# Patient Record
Sex: Female | Born: 1990 | Race: Black or African American | Hispanic: No | Marital: Single | Smoking: Never smoker
Health system: Southern US, Community
[De-identification: ages and names within clinical notes are randomized; demographics above are authoritative.]

## PROBLEM LIST (undated history)

## (undated) ENCOUNTER — Inpatient Hospital Stay (HOSPITAL_COMMUNITY): Payer: Self-pay

## (undated) DIAGNOSIS — T7840XA Allergy, unspecified, initial encounter: Secondary | ICD-10-CM

## (undated) DIAGNOSIS — L709 Acne, unspecified: Secondary | ICD-10-CM

## (undated) DIAGNOSIS — E119 Type 2 diabetes mellitus without complications: Secondary | ICD-10-CM

## (undated) HISTORY — DX: Acne, unspecified: L70.9

## (undated) HISTORY — DX: Allergy, unspecified, initial encounter: T78.40XA

## (undated) HISTORY — DX: Type 2 diabetes mellitus without complications: E11.9

---
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
## 2012-08-23 ENCOUNTER — Emergency Department: Payer: Self-pay | Admitting: Emergency Medicine

## 2015-04-11 ENCOUNTER — Ambulatory Visit
Admission: EM | Admit: 2015-04-11 | Discharge: 2015-04-11 | Disposition: A | Discharge status: Home / Self Care | Payer: Self-pay

## 2015-04-12 ENCOUNTER — Ambulatory Visit
Admission: EM | Admit: 2015-04-12 | Discharge: 2015-04-12 | Disposition: A | Discharge status: Home / Self Care | Payer: BLUE CROSS/BLUE SHIELD | Attending: Family Medicine | Admitting: Family Medicine

## 2015-04-12 ENCOUNTER — Encounter: Payer: Self-pay | Admitting: Emergency Medicine

## 2015-04-12 DIAGNOSIS — L298 Other pruritus: Secondary | ICD-10-CM

## 2015-04-12 DIAGNOSIS — N898 Other specified noninflammatory disorders of vagina: Secondary | ICD-10-CM

## 2015-04-12 DIAGNOSIS — B373 Candidiasis of vulva and vagina: Secondary | ICD-10-CM | POA: Diagnosis not present

## 2015-04-12 DIAGNOSIS — B3731 Acute candidiasis of vulva and vagina: Secondary | ICD-10-CM

## 2015-04-12 LAB — CHLAMYDIA/NGC RT PCR (ARMC ONLY)
Chlamydia Tr: NOT DETECTED
N gonorrhoeae: NOT DETECTED

## 2015-04-12 LAB — WET PREP, GENITAL
Clue Cells Wet Prep HPF POC: NONE SEEN
Sperm: NONE SEEN
Trich, Wet Prep: NONE SEEN
Yeast Wet Prep HPF POC: NONE SEEN

## 2015-04-12 MED ORDER — FLUCONAZOLE 150 MG PO TABS: 150.0000 mg | ORAL_TABLET | Freq: Every day | ORAL | Status: DC

## 2015-04-12 NOTE — ED Provider Notes (Signed)
CSN: [id number]     Arrival date & time 04/12/15  [id number] History   First MD Initiated Contact with Patient 04/12/15 1133     Chief Complaint  Patient presents with  . Vaginal Itching   (Consider location/radiation/quality/duration/timing/severity/associated sxs/prior Treatment) HPI   A 24 year old female who presents with 3 day history of vaginal itching with a white vaginal discharge. He states that she's never had a yeast infection in the past. She is on chronic doxycycline use because of acne. She does not have any urinary tract symptoms. She is sexually active in a monogamous relationship.  History reviewed. No pertinent past medical history. History reviewed. No pertinent past surgical history. History reviewed. No pertinent family history. Social History  Substance Use Topics  . Smoking status: Former Games developermoker  . Smokeless tobacco: None  . Alcohol Use: Yes   OB History    No data available     Review of Systems  Constitutional: Negative for fever, chills, activity change and fatigue.  Genitourinary: Positive for vaginal discharge.  All other systems reviewed and are negative.   Allergies  Review of patient's allergies indicates no known allergies.  Home Medications   Prior to Admission medications   Medication Sig Start Date End Date Taking? Authorizing Provider  doxycycline (DORYX) 100 MG EC tablet Take 100 mg by mouth 2 (two) times daily.   Yes Historical Provider, MD  norethindrone-ethinyl estradiol (JUNEL FE,GILDESS FE,LOESTRIN FE) 1-20 MG-MCG tablet Take 1 tablet by mouth daily.   Yes Historical Provider, MD  fluconazole (DIFLUCAN) 150 MG tablet Take 1 tablet (150 mg total) by mouth daily. 04/12/15   Lutricia FeilWilliam P Roemer, PA-C   Meds Ordered and Administered this Visit  Medications - No data to display  BP 126/71 mmHg  Pulse 66  Temp(Src) 97.2 F (36.2 C) (Tympanic)  Resp 16  Ht 5\' 11"  (1.803 m)  Wt 190 lb (86.183 kg)  BMI 26.51 kg/m2  SpO2 98%  LMP  03/23/2015 (Approximate) No data found.   Physical Exam  Constitutional: She is oriented to person, place, and time. She appears well-developed and well-nourished. No distress.  HENT:  Head: Normocephalic and atraumatic.  Eyes: Conjunctivae are normal. Pupils are equal, round, and reactive to light.  Neck: Normal range of motion. Neck supple.  Abdominal: Soft. Bowel sounds are normal. She exhibits no distension. There is no tenderness. There is no rebound and no guarding.  Genitourinary: Uterus normal. Vaginal discharge found.  Examination was carried out with Herbert SetaHeather RN as my assistant and chaperone external genitalia appears normal but with a white creamy discharge noticeable at the introitus. There is no inflammation seen and no erythema is present. Speculum exam showed the vaginal walls to be coated with the white discharge. Samples were obtained from the vaginal walls and submitted for analysis. The speculum was withdrawn and a bimanual was performed. There is no tenderness to cervical motion no tenderness in either adnexa.  Musculoskeletal: Normal range of motion. She exhibits no edema or tenderness.  Neurological: She is alert and oriented to person, place, and time.  Skin: Skin is warm and dry. She is not diaphoretic.  Psychiatric: She has a normal mood and affect. Her behavior is normal. Judgment and thought content normal.  Nursing note and vitals reviewed.   ED Course  Procedures (including critical care time)  Labs Review Labs Reviewed  WET PREP, GENITAL - Abnormal; Notable for the following:    WBC, Wet Prep HPF POC MANY (*)    All  other components within normal limits  CHLAMYDIA/NGC RT PCR [facility] ONLY)    Imaging Review No results found.   Visual Acuity Review  Right Eye Distance:   Left Eye Distance:   Bilateral Distance:    Right Eye Near:   Left Eye Near:    Bilateral Near:         MDM   1. Vaginal itching   2. Candida vaginitis     Discharge Medication List as of 04/12/2015 12:32 PM    START taking these medications   Details  fluconazole (DIFLUCAN) 150 MG tablet Take 1 tablet (150 mg total) by mouth daily., Starting 04/12/2015, Until Discontinued, Normal      Plan: 1. Test/x-ray results and diagnosis reviewed with patient 2. rx as per orders; risks, benefits, potential side effects reviewed with patient 3. Recommend supportive treatment with these medications and was cautioned regarding use of alcohol or sex during treatment. She will call tomorrow for results of the gonorrhea chlamydia panel. I advised her that even though no yeast these cells were seen on the wet prep that her symptoms and the character of her discharge appear to be Candida. Therefore we'll treat her with the Diflucan at this time. 4. F/u prn if symptoms worsen or don't improve     Lutricia Feil, PA-C 04/12/15 1246

## 2015-04-12 NOTE — ED Notes (Signed)
Patient c/o vaginal itching and white vaginal discharge for 3 days.

## 2015-04-12 NOTE — Discharge Instructions (Signed)

## 2015-04-15 ENCOUNTER — Telehealth: Payer: Self-pay | Admitting: *Deleted

## 2015-04-15 NOTE — ED Notes (Signed)
Called patient and informed her that her lab results came back negative for any communicable disease. Patient confirmed understanding of information.

## 2016-02-21 ENCOUNTER — Inpatient Hospital Stay (HOSPITAL_COMMUNITY)
Admission: AD | Admit: 2016-02-21 | Discharge: 2016-02-21 | Disposition: A | Discharge status: Home / Self Care | Payer: BLUE CROSS/BLUE SHIELD | Source: Ambulatory Visit | Attending: Obstetrics and Gynecology | Admitting: Obstetrics and Gynecology

## 2016-02-21 ENCOUNTER — Encounter (HOSPITAL_COMMUNITY): Payer: Self-pay

## 2016-02-21 ENCOUNTER — Inpatient Hospital Stay (HOSPITAL_COMMUNITY): Payer: BLUE CROSS/BLUE SHIELD

## 2016-02-21 DIAGNOSIS — R102 Pelvic and perineal pain: Secondary | ICD-10-CM | POA: Diagnosis present

## 2016-02-21 DIAGNOSIS — Z3A10 10 weeks gestation of pregnancy: Secondary | ICD-10-CM | POA: Diagnosis not present

## 2016-02-21 DIAGNOSIS — Z87891 Personal history of nicotine dependence: Secondary | ICD-10-CM | POA: Diagnosis not present

## 2016-02-21 DIAGNOSIS — O26891 Other specified pregnancy related conditions, first trimester: Secondary | ICD-10-CM | POA: Insufficient documentation

## 2016-02-21 DIAGNOSIS — O3680X0 Pregnancy with inconclusive fetal viability, not applicable or unspecified: Secondary | ICD-10-CM

## 2016-02-21 LAB — POCT PREGNANCY, URINE: Preg Test, Ur: POSITIVE — AB

## 2016-02-21 LAB — URINALYSIS, ROUTINE W REFLEX MICROSCOPIC
BILIRUBIN URINE: NEGATIVE
Glucose, UA: 250 mg/dL — AB
Hgb urine dipstick: NEGATIVE
KETONES UR: NEGATIVE mg/dL
Leukocytes, UA: NEGATIVE
NITRITE: NEGATIVE
PH: 6.5 (ref 5.0–8.0)
PROTEIN: NEGATIVE mg/dL
Specific Gravity, Urine: 1.02 (ref 1.005–1.030)

## 2016-02-21 LAB — HCG, QUANTITATIVE, PREGNANCY: HCG, BETA CHAIN, QUANT, S: 9455 m[IU]/mL — AB (ref <5)

## 2016-02-21 NOTE — MAU Note (Signed)
Went to [facility] in [place], on 10/31 was told couldn't find a heart beat, unsure of LMP thinks first of September, found out she was pregnant on 01/13/16 positive home UPT. Denies pain or bleeding. Had HCG drawn x 2 and number was going down. Wants second opinion regarding possible miscarriage.

## 2016-02-21 NOTE — MAU Note (Signed)
Nicole BourgeoisMarie Chambers CNM in Triage to discuss u/s results and d/c plan with pt. Written and verbal d/c instructions given by CNM and pt d/c home from Triage

## 2016-02-21 NOTE — MAU Provider Note (Signed)
Chief Complaint: No chief complaint on file.   None       SUBJECTIVE HPI: Nicole Chambers is a 25 y.o. G1P0 at Unknown by LMP who presents to maternity admissions reporting desire for second opinion about her pregnancy status. Had originally had some bleeding but has none now.  Was told her HCG levels were dropping and that she was having a miscarriage but she does not believe it.  Wants a second opinion.  HCG levels were around 1500 a week to 10 days ago.. She denies vaginal bleeding, vaginal itching/burning, urinary symptoms, h/a, dizziness, n/v, or fever/chills.     Other  This is a recurrent problem. The current episode started 1 to 4 weeks ago. The problem has been unchanged. Pertinent negatives include no abdominal pain, chills, congestion, fatigue, fever, nausea or vomiting. Nothing aggravates the symptoms. She has tried nothing for the symptoms.   RN Note: [facility] in [place], on 10/31 was told couldn't find a heart beat, unsure of LMP thinks first of September, found out she was pregnant on 01/13/16 positive home UPT. Denies pain or bleeding. Had HCG drawn x 2 and number was going down. Wants second opinion regarding possible miscarriage.   No past medical history on file. No past surgical history on file. Social History   Social History  . Marital status: Single    Spouse name: N/A  . Number of children: N/A  . Years of education: N/A   Occupational History  . Not on file.   Social History Main Topics  . Smoking status: Former Games developermoker  . Smokeless tobacco: Not on file  . Alcohol use Yes  . Drug use: Unknown  . Sexual activity: Not on file   Other Topics Concern  . Not on file   Social History Narrative  . No narrative on file   No current facility-administered medications on file prior to encounter.    Current Outpatient Prescriptions on File Prior to Encounter  Medication Sig Dispense Refill  . doxycycline (DORYX) 100 MG EC tablet Take 100 mg by mouth 2  (two) times daily.    . fluconazole (DIFLUCAN) 150 MG tablet Take 1 tablet (150 mg total) by mouth daily. 1 tablet 1  . norethindrone-ethinyl estradiol (JUNEL FE,GILDESS FE,LOESTRIN FE) 1-20 MG-MCG tablet Take 1 tablet by mouth daily.     No Known Allergies  I have reviewed patient's Past Medical Hx, Surgical Hx, Family Hx, Social Hx, medications and allergies.   ROS:  Review of Systems  Constitutional: Negative for chills, fatigue and fever.  HENT: Negative for congestion.   Gastrointestinal: Negative for abdominal pain, nausea and vomiting.    Other systems negative   Physical Exam  Patient Vitals for the past 24 hrs:  BP Temp Temp src Pulse Resp Height Weight  02/21/16 1625 129/77 - - 81 18 - -  02/21/16 1624 - 98.1 F (36.7 C) Oral - - 5\' 11"  (1.803 m) 219 lb (99.3 kg)   Physical Exam  Constitutional: Well-developed, well-nourished female in no acute distress.  Cardiovascular: normal rate Respiratory: normal effort GI: Abd soft,non tender MS: Extremities nontender, no edema, normal ROM Neurologic: Alert and oriented x 4.  GU: Neg CVAT.  Pelvic not indicated   LAB RESULTS Results for orders placed or performed during the hospital encounter of 02/21/16 (from the past 24 hour(s))  Urinalysis, Routine w reflex microscopic (not at [facility])     Status: Abnormal   Collection Time: 02/21/16  4:30 PM  Result Value Ref Range   Color, Urine YELLOW YELLOW   APPearance HAZY (A) CLEAR   Specific Gravity, Urine 1.020 1.005 - 1.030   pH 6.5 5.0 - 8.0   Glucose, UA 250 (A) NEGATIVE mg/dL   Hgb urine dipstick NEGATIVE NEGATIVE   Bilirubin Urine NEGATIVE NEGATIVE   Ketones, ur NEGATIVE NEGATIVE mg/dL   Protein, ur NEGATIVE NEGATIVE mg/dL   Nitrite NEGATIVE NEGATIVE   Leukocytes, UA NEGATIVE NEGATIVE  Pregnancy, urine POC     Status: Abnormal   Collection Time: 02/21/16  4:34 PM  Result Value Ref Range   Preg Test, Ur POSITIVE (A) NEGATIVE  hCG, quantitative, pregnancy      Status: Abnormal   Collection Time: 02/21/16  4:46 PM  Result Value Ref Range   hCG, Beta Chain, Quant, S 9,455 (H) <5 mIU/mL       IMAGING Possible intrauterine gestational sac, around 5 wks No yolk sac or embryo  MAU Management/MDM: Ordered usual first trimester r/o ectopic labs.   Pelvic exam and cultures done at other practice Will check baseline Ultrasound to rule out ectopic and check on status  Consult Dr Despina HiddenEure with presentation, exam findings, and results.  He recommends repeating HCG in 5 days.  This bleeding/pain can represent a normal pregnancy with bleeding, spontaneous abortion or even an ectopic which can be life-threatening.  The process as listed above helps to determine which of these is present.    ASSESSMENT 1. Pelvic pain in pregnant patient at less than [redacted] weeks gestation   2. Pelvic pain in pregnant patient at less than [redacted] weeks gestation   3.     Pregnancy of unknown location  PLAN Discharge home Plan to repeat HCG level on Wednesday 02/26/16  per 11:00 am schedule Will repeat  Ultrasound in about 7-10 days if HCG levels double appropriately     Pt stable at time of discharge. Encouraged to return here or to other Urgent Care/ED if she develops worsening of symptoms, increase in pain, fever, or other concerning symptoms.    Wynelle BourgeoisMarie Zalan Chambers CNM, MSN Certified Nurse-Midwife 02/21/2016  5:55 PM

## 2016-02-21 NOTE — Discharge Instructions (Signed)

## 2016-02-26 ENCOUNTER — Ambulatory Visit: Payer: BLUE CROSS/BLUE SHIELD

## 2016-02-26 DIAGNOSIS — O3680X0 Pregnancy with inconclusive fetal viability, not applicable or unspecified: Secondary | ICD-10-CM

## 2016-02-26 LAB — HCG, QUANTITATIVE, PREGNANCY: HCG, BETA CHAIN, QUANT, S: 4927 m[IU]/mL — AB (ref <5)

## 2016-02-26 NOTE — Progress Notes (Signed)
Patient presented to office today for a repeat STAT beta check per MAU note and patient. Patient reports no pain or bleeding at this time. Patient reports having a OB in [place] but only came to the hospital for a second opinion.   Reviewed U/S and quant beta with Dr.Arnold who stated patient should returned back to her OB/GYN where she was receiving care. I advised to follow up with them to discuss medication options. Patient voice understanding at this time.

## 2016-02-27 ENCOUNTER — Emergency Department
Admission: EM | Admit: 2016-02-27 | Discharge: 2016-02-27 | Disposition: A | Discharge status: Home / Self Care | Payer: BLUE CROSS/BLUE SHIELD | Attending: Emergency Medicine | Admitting: Emergency Medicine

## 2016-02-27 ENCOUNTER — Encounter: Payer: Self-pay | Admitting: Urgent Care

## 2016-02-27 ENCOUNTER — Emergency Department: Payer: BLUE CROSS/BLUE SHIELD

## 2016-02-27 DIAGNOSIS — O039 Complete or unspecified spontaneous abortion without complication: Secondary | ICD-10-CM | POA: Diagnosis not present

## 2016-02-27 DIAGNOSIS — N898 Other specified noninflammatory disorders of vagina: Secondary | ICD-10-CM | POA: Insufficient documentation

## 2016-02-27 DIAGNOSIS — Z87891 Personal history of nicotine dependence: Secondary | ICD-10-CM | POA: Diagnosis not present

## 2016-02-27 DIAGNOSIS — Z3A01 Less than 8 weeks gestation of pregnancy: Secondary | ICD-10-CM | POA: Diagnosis not present

## 2016-02-27 DIAGNOSIS — O26891 Other specified pregnancy related conditions, first trimester: Secondary | ICD-10-CM | POA: Diagnosis present

## 2016-02-27 LAB — CBC
HEMATOCRIT: 38.4 % (ref 35.0–47.0)
Hemoglobin: 12.7 g/dL (ref 12.0–16.0)
MCH: 30.3 pg (ref 26.0–34.0)
MCHC: 33.1 g/dL (ref 32.0–36.0)
MCV: 91.4 fL (ref 80.0–100.0)
PLATELETS: 352 10*3/uL (ref 150–440)
RBC: 4.21 MIL/uL (ref 3.80–5.20)
RDW: 12.8 % (ref 11.5–14.5)
WBC: 10.7 10*3/uL (ref 3.6–11.0)

## 2016-02-27 LAB — HCG, QUANTITATIVE, PREGNANCY: hCG, Beta Chain, Quant, S: 3802 m[IU]/mL — ABNORMAL HIGH (ref <5)

## 2016-02-27 MED ORDER — NAPROXEN 500 MG PO TABS: 500.0000 mg | ORAL_TABLET | Freq: Two times a day (BID) | ORAL | 2 refills | Status: AC

## 2016-02-27 NOTE — ED Provider Notes (Signed)
[facility] Emergency Department Provider Note  ____________________________________________  Time seen: Approximately 9:29 PM  I have reviewed the triage vital signs and the nursing notes.   HISTORY  Chief Complaint Vaginal Bleeding and Abdominal Cramping   HPI Nicole Chambers is a 25 y.o. female G1P0 who presents for evaluation of vaginal bleeding and abdominal cramping. Patient was told by her OBGYN yesterday for f/u hCG levels and US and was told she had a missed miscarriage. She was given the option of a D&C versus natural passage and she opted for natural passage. She reports that yesterday evening she started to have vaginal bleeding which has increased in intensity today. She is also complaining of suprapubic abdominal cramping, moderate, since earlier this morning, associated with the vaginal bleeding. No nausea or vomiting, no dysuria hematuria, no fever, no chest pain or shortness of breath.   History reviewed. No pertinent past medical history.  There are no active problems to display for this patient.   History reviewed. No pertinent surgical history.  Prior to Admission medications   Medication Sig Start Date End Date Taking? Authorizing Provider  doxycycline (DORYX) 100 MG EC tablet Take 100 mg by mouth 2 (two) times daily.    Historical Provider, MD  fluconazole (DIFLUCAN) 150 MG tablet Take 1 tablet (150 mg total) by mouth daily. 04/12/15   Lutricia FeilWilliam P Roemer, PA-C    Allergies Patient has no known allergies.  Family History  Problem Relation Age of Onset  . Hypertension Mother   . Thyroid disease Mother     Social History Social History  Substance Use Topics  . Smoking status: Former Games developermoker  . Smokeless tobacco: Never Used  . Alcohol use No    Review of Systems  Constitutional: Negative for fever. Eyes: Negative for visual changes. ENT: Negative for sore throat. Cardiovascular: Negative for chest pain. Respiratory: Negative  for shortness of breath. Gastrointestinal: + suprapubic abdominal pain. No vomiting or diarrhea. Genitourinary: Negative for dysuria. + vaginal bleeding Musculoskeletal: Negative for back pain. Skin: Negative for rash. Neurological: Negative for headaches, weakness or numbness.  ____________________________________________   PHYSICAL EXAM:  VITAL SIGNS: ED Triage Vitals  Enc Vitals Group     BP 02/27/16 1925 138/83     Pulse Rate 02/27/16 1925 69     Resp 02/27/16 1925 18     Temp 02/27/16 1925 98 F (36.7 C)     Temp Source 02/27/16 1925 Oral     SpO2 02/27/16 1925 99 %     Weight 02/27/16 1926 219 lb (99.3 kg)     Height 02/27/16 1926 5\' 11"  (1.803 m)     Head Circumference --      Peak Flow --      Pain Score 02/27/16 1926 10     Pain Loc --      Pain Edu? --      Excl. in GC? --     Constitutional: Alert and oriented. Well appearing and in no apparent distress. HEENT:      Head: Normocephalic and atraumatic.         Eyes: Conjunctivae are normal. Sclera is non-icteric. EOMI. PERRL      Mouth/Throat: Mucous membranes are moist.       Neck: Supple with no signs of meningismus. Cardiovascular: Regular rate and rhythm. No murmurs, gallops, or rubs. 2+ symmetrical distal pulses are present in all extremities. No JVD. Respiratory: Normal respiratory effort. Lungs are clear to auscultation bilaterally. No wheezes, crackles,  or rhonchi.  Gastrointestinal: Soft, non tender, and non distended with positive bowel sounds. No rebound or guarding. Genitourinary: No CVA tenderness. Pelvic exam: Normal external genitalia, no rashes or lesions. Normal cervical mucus. Os closed with gestational sac in the os which was evacuated. No cervical motion tenderness.  No uterine or adnexal tenderness.   Musculoskeletal: Nontender with normal range of motion in all extremities. No edema, cyanosis, or erythema of extremities. Neurologic: Normal speech and language. Face is symmetric. Moving all  extremities. No gross focal neurologic deficits are appreciated. Skin: Skin is warm, dry and intact. No rash noted. Psychiatric: Mood and affect are normal. Speech and behavior are normal.  ____________________________________________   LABS (all labs ordered are listed, but only abnormal results are displayed)  Labs Reviewed  HCG, QUANTITATIVE, PREGNANCY - Abnormal; Notable for the following:       Result Value   hCG, Beta Chain, Quant, S 3,802 (*)    All other components within normal limits  CBC   ____________________________________________  EKG  none ____________________________________________  RADIOLOGY  TVUS:  The suspected gestational sac has migrated to the cervix. Combined with a declining beta HCG and absent visualization of the embryo, this is likely to represent a spontaneous abortion in progress. ____________________________________________   PROCEDURES  Procedure(s) performed: None Procedures Critical Care performed:  None ____________________________________________   INITIAL IMPRESSION / ASSESSMENT AND PLAN / ED COURSE  25 y.o. female G1P0 who presents for evaluation of vaginal bleeding and abdominal cramping. Patient told yesterday she had a missed miscarriage and today having vaginal bleeding and abdominal cramping. Vital signs are within normal limits. Physical exam showing gestational sac in the os which was evacuated, otherwise normal exam. HCG trending down. Repeat US done while patient was in triage showed spontaneous abortion in progress. Patient will be discharged home with close f/u with Obgyn for repeat US in 1- 2 weeks. Recommended that patient returns to the ER or Obgyn if she develops a fever for concerns for septic abortion.   Clinical Course     Pertinent labs & imaging results that were available during my care of the patient were reviewed by me and considered in my medical decision making (see chart for  details).    ____________________________________________   FINAL CLINICAL IMPRESSION(S) / ED DIAGNOSES  Final diagnoses:  Spontaneous miscarriage      NEW MEDICATIONS STARTED DURING THIS VISIT:  New Prescriptions   No medications on file     Note:  This document was prepared using Dragon voice recognition software and may include unintentional dictation errors.    Nita Sicklearolina Annarae Macnair, MD 02/27/16 2158

## 2016-02-27 NOTE — ED Triage Notes (Signed)
Patient presents with c/o heavy vaginal bleeding with concurrent abdominal cramping. Of note, patient reports that she is pregnant; saw [facility] yesterday and was given management options due to her having a non-viable pregnancy; cites that her HCG has dropped with serial monitoring. Of note, patient reports that she picked up Methergine today, but has not taken it due to the amount of bleeding that she has had today. Patient reports taking Percocet this afternoon.

## 2018-03-30 IMAGING — US US OB COMP LESS 14 WK
1 series · 14 of 28 positions shown · non-contrast
Comparison: 02/21/2016 pelvic ultrasound.

CLINICAL DATA: 25 y/o F; abdominal cramping and vaginal bleeding
with falling beta HCG values.

EXAM:
OBSTETRIC <14 WK US AND TRANSVAGINAL OB US
TECHNIQUE: Both transabdominal and transvaginal ultrasound examinations were
performed for complete evaluation of the gestation as well as the
maternal uterus, adnexal regions, and pelvic cul-de-sac.
Transvaginal technique was performed to assess early pregnancy.

[Series 1: us ob comp less 14 wk · 0.28mm/px · 14 of 120 slices shown]
[im 5/120]
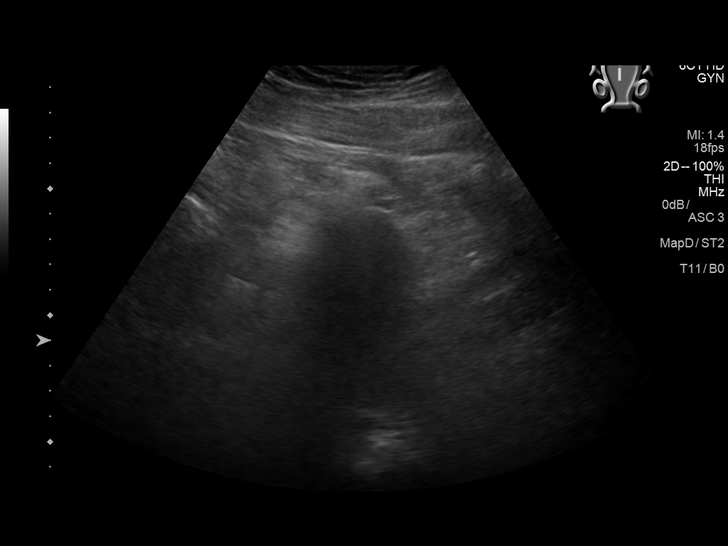
[im 14/120]
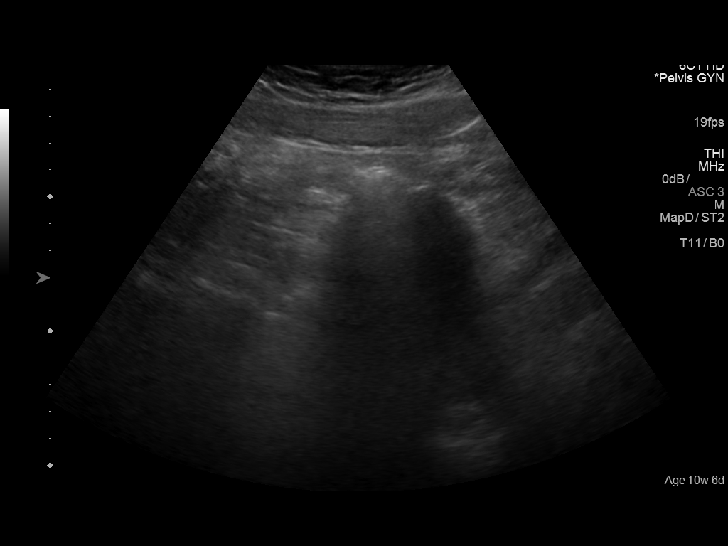
[im 23/120]
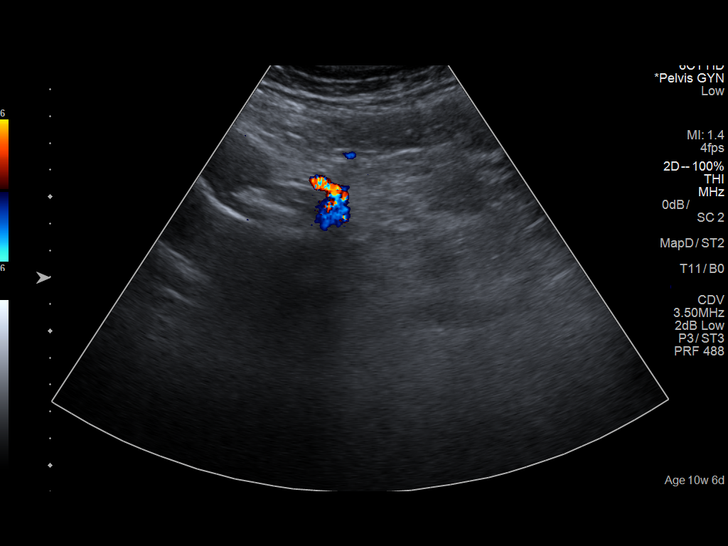
[im 31/120]
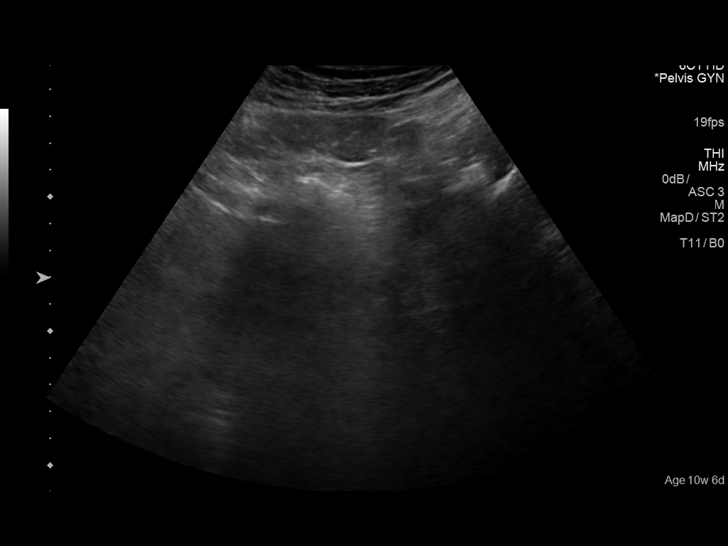
[im 40/120]
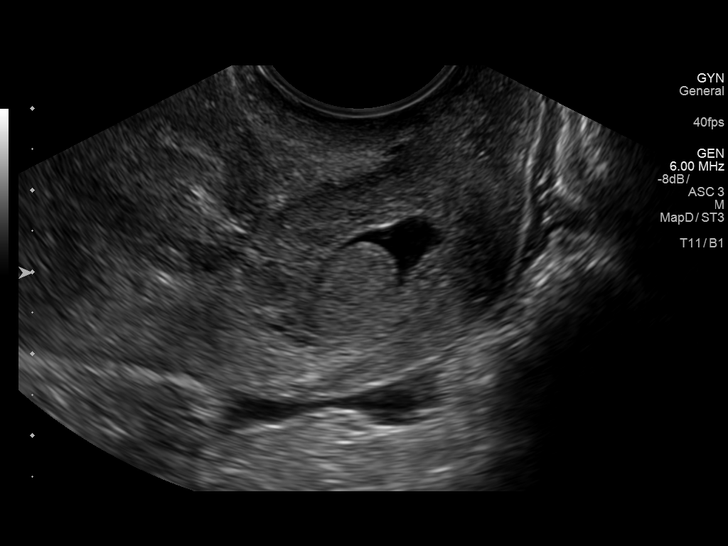
[im 49/120]
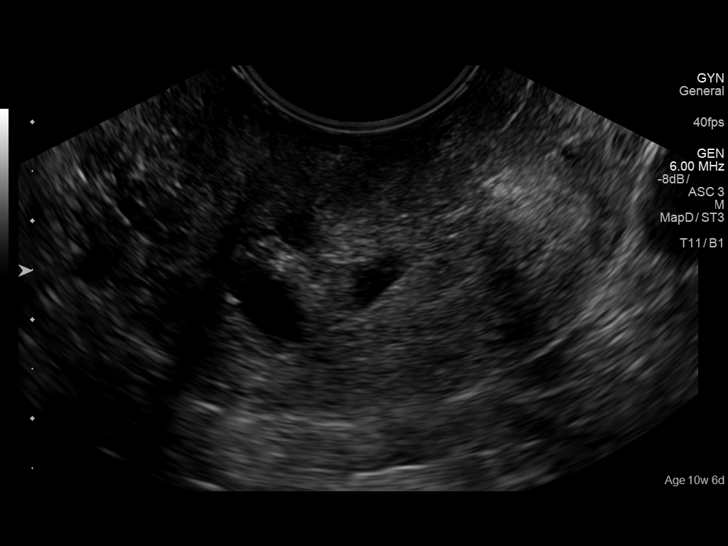
[im 58/120]
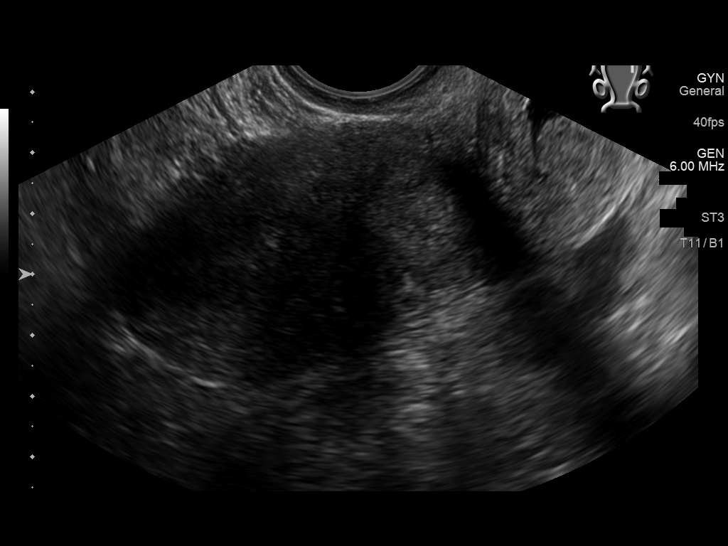
[im 67/120]
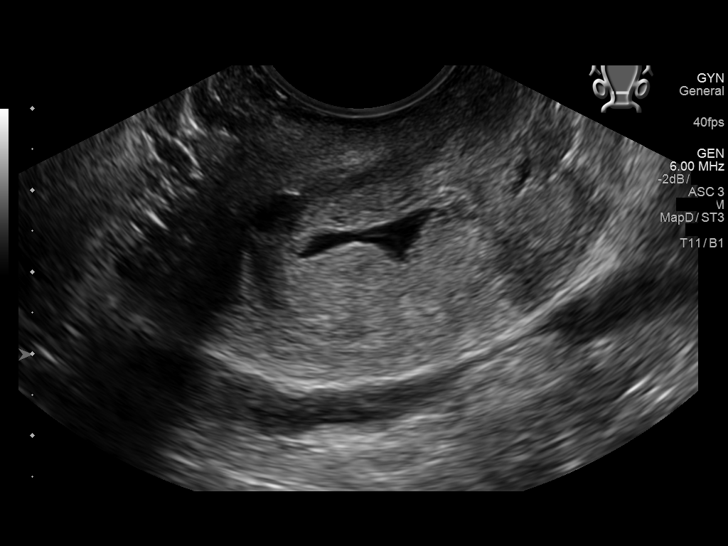
[im 75/120]
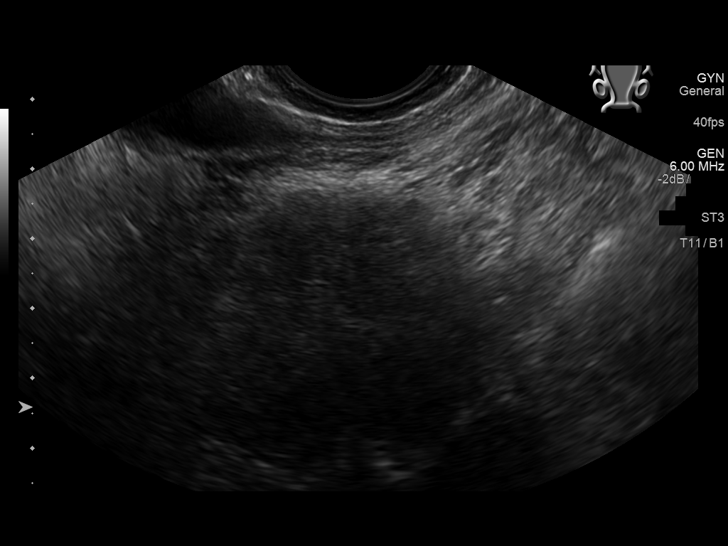
[im 84/120]
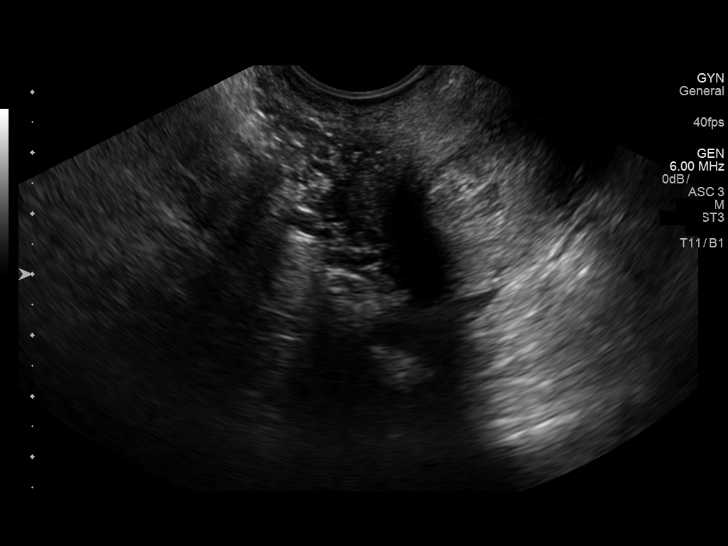
[im 93/120]
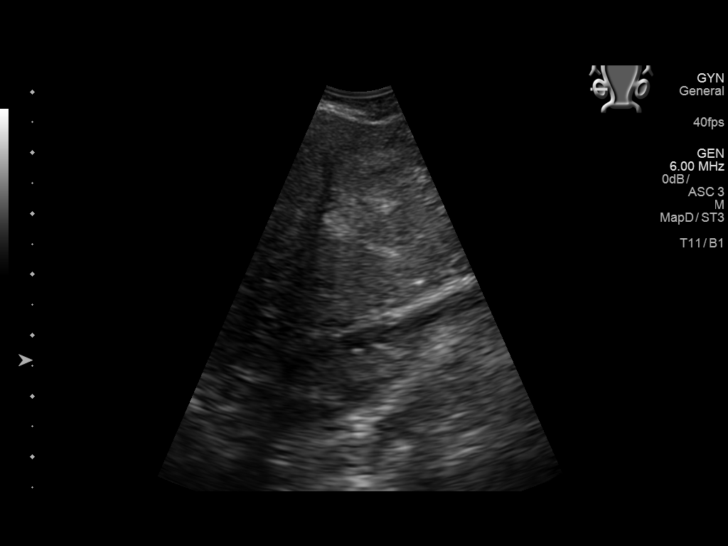
[im 102/120]
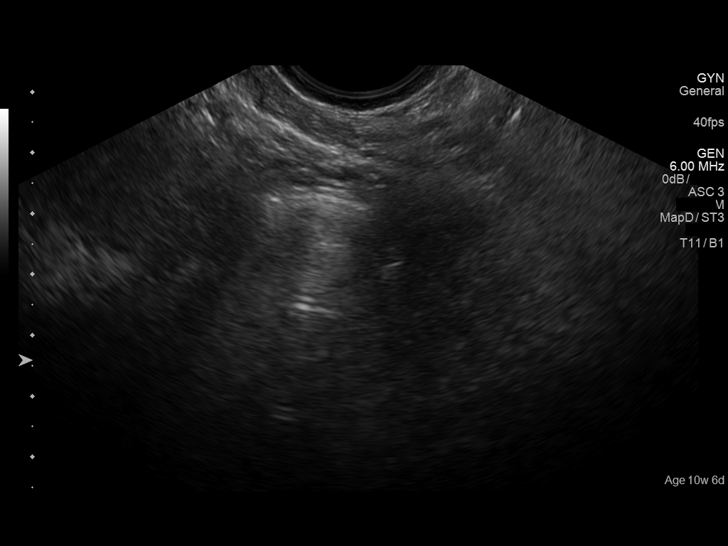
[im 111/120]
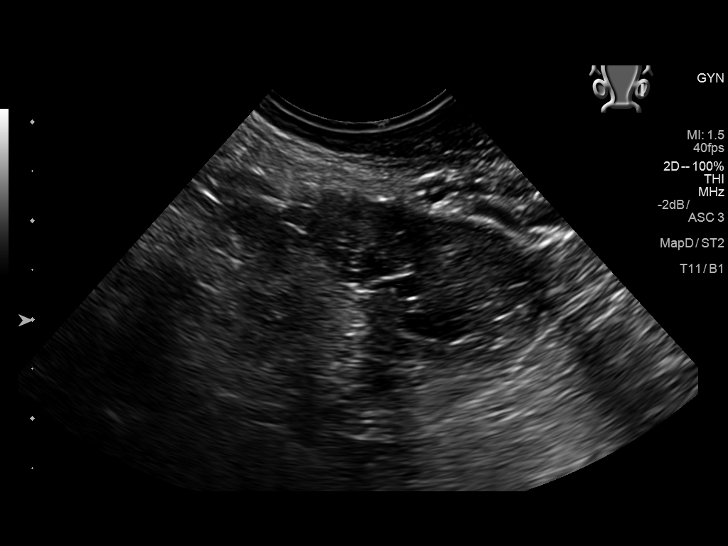
[im 120/120]
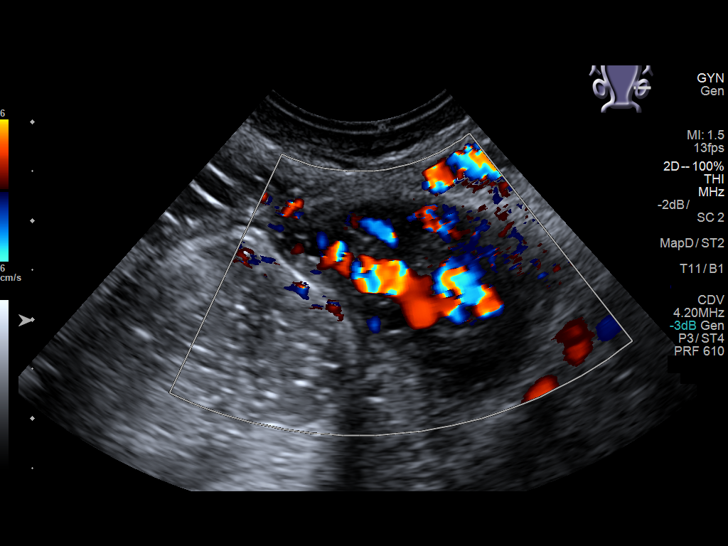

[14 of 28 positions shown; findings below may reference images not displayed]

FINDINGS: Intrauterine gestational sac: Irregular shape fluid-filled structure
within the cervix, no gestational sac is seen within the uterine
fundus where it was previously identified.

Yolk sac:  Not Visualized.

Embryo:  Not Visualized.

Cardiac Activity: Not Visualized.

MSD: 11  mm   5 w   6  d

Subchorionic hemorrhage:  None visualized.

Maternal uterus/adnexae: Unremarkable.
IMPRESSION: The suspected gestational sac has migrated to the cervix. Combined
with a declining beta HCG and absent visualization of the embryo,
this is likely to represent a spontaneous abortion in progress.

By: Kristian Witherspoon M.D.

## 2018-08-04 LAB — HM HIV SCREENING LAB: HM HIV Screening: NEGATIVE

## 2018-11-07 IMAGING — US US OB COMP LESS 14 WK
1 series · 15 of 28 positions shown · non-contrast
Comparison: None.

CLINICAL DATA: Estimated gestational age by last menstrual period
is 10 weeks 0 days.

EXAM:
OBSTETRIC <14 WK US AND TRANSVAGINAL OB US
TECHNIQUE: Both transabdominal and transvaginal ultrasound examinations were
performed for complete evaluation of the gestation as well as the
maternal uterus, adnexal regions, and pelvic cul-de-sac.
Transvaginal technique was performed to assess early pregnancy.

[Series 1: us ob comp less 14 wk · 62 acquisitions, 15 frames shown]
[im 1/62]
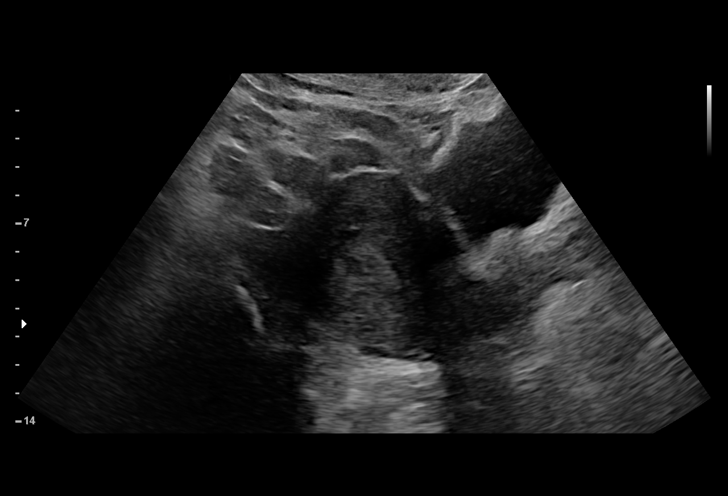
[im 5/62]
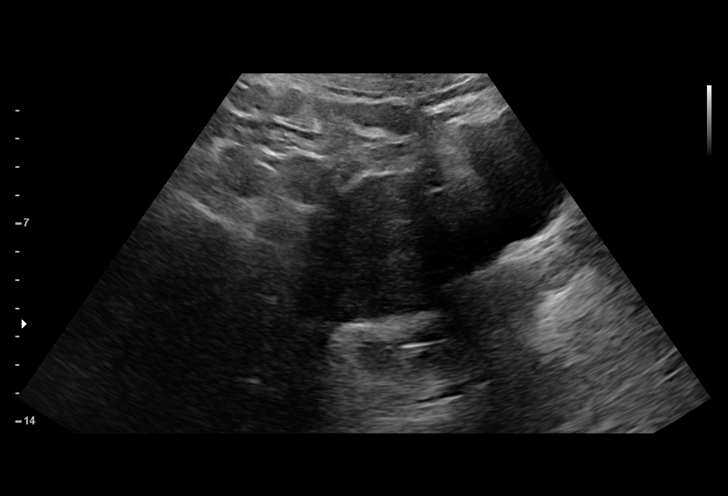
[im 10/62]
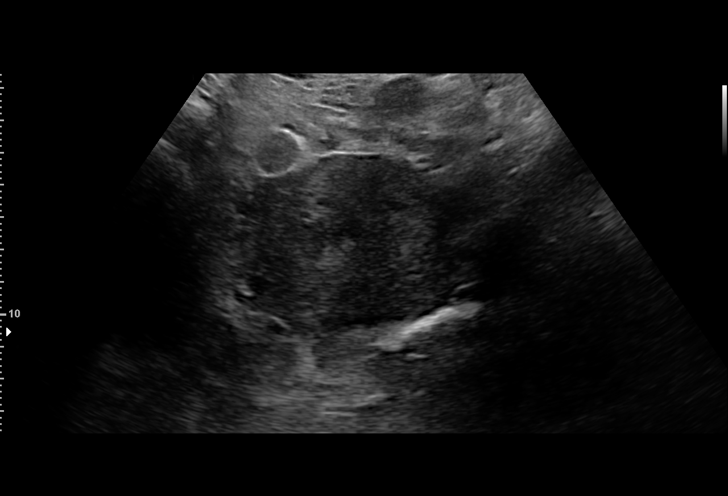
[im 14/62]
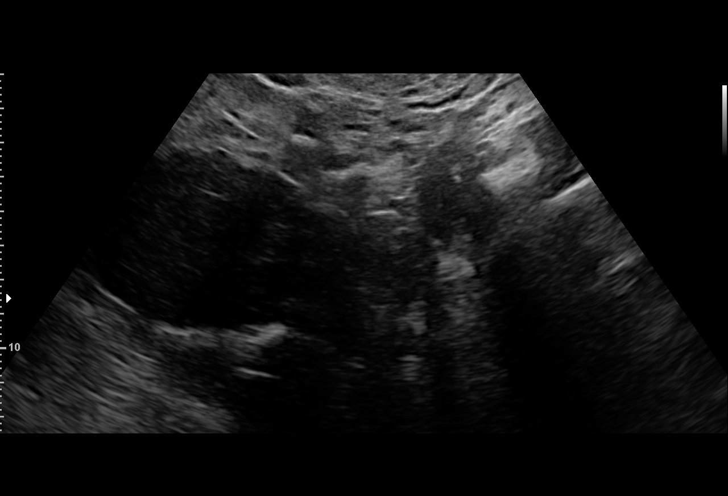
[im 19/62]
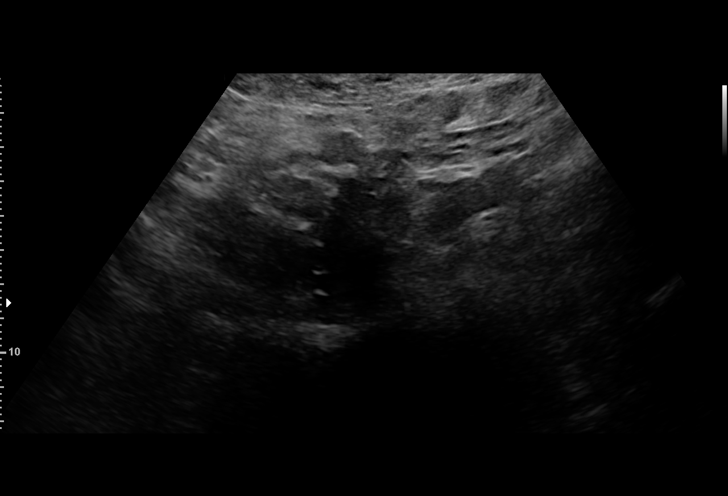
[im 23/62]
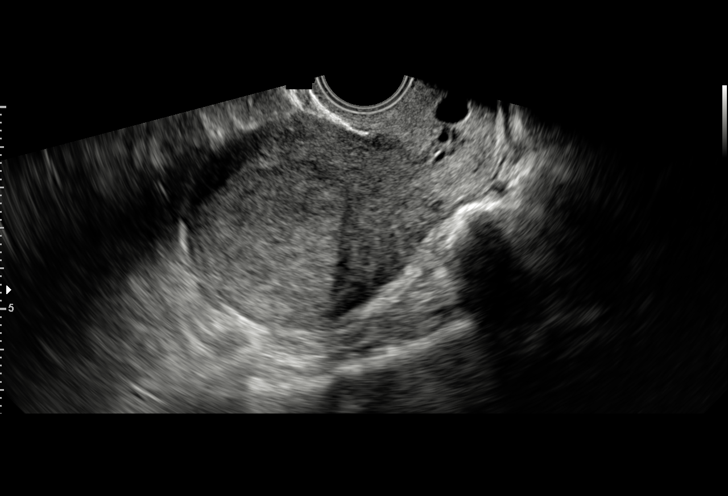
[im 28/62]
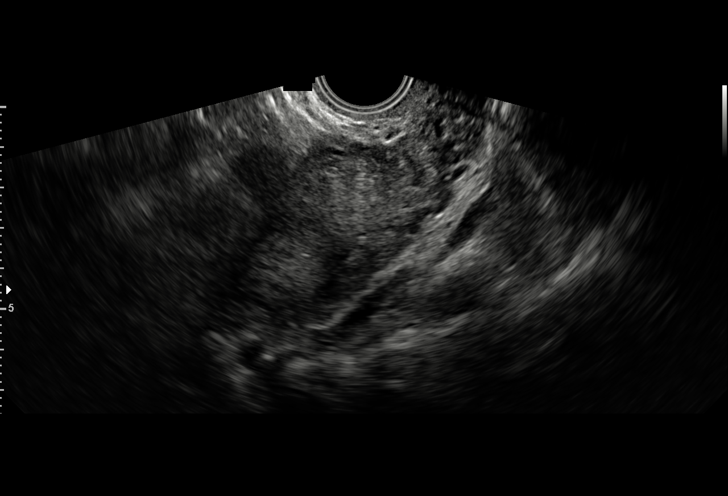
[im 32/62]
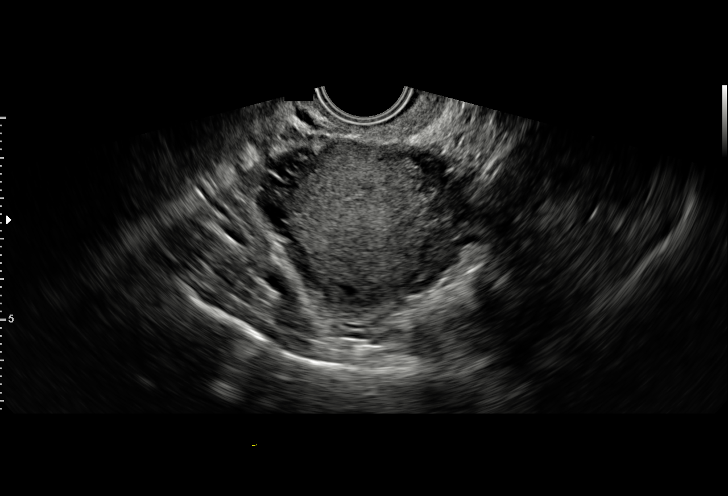
[im 34/62]
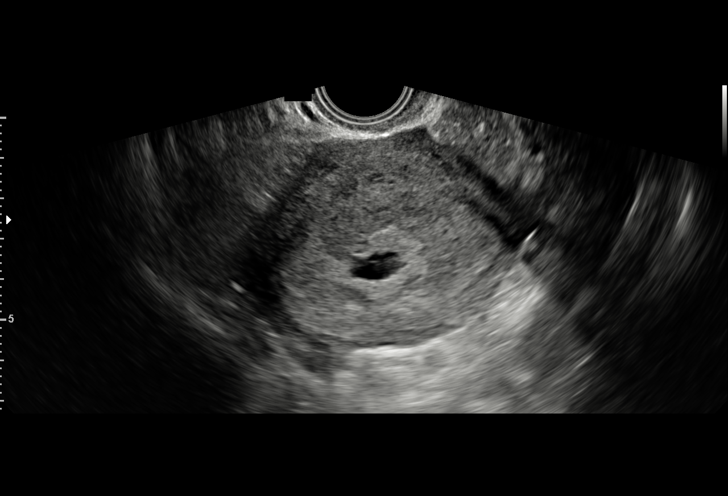
[im 39/62]
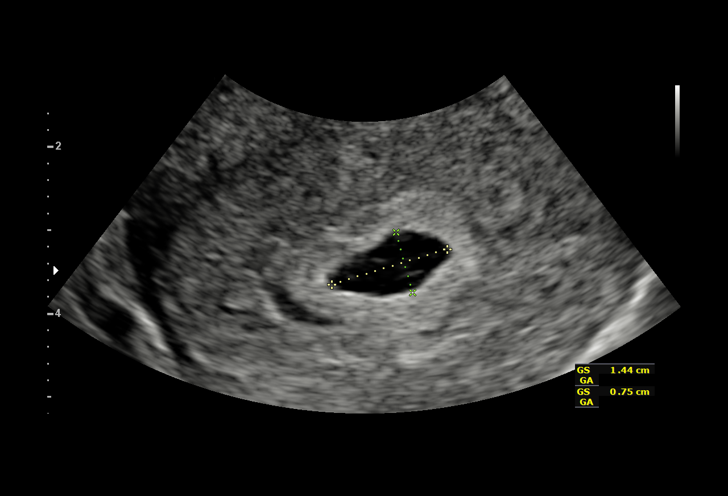
[im 43/62]
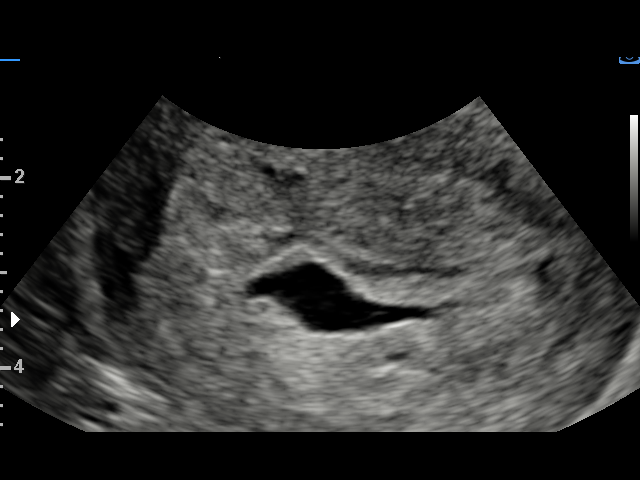
[im 48/62]
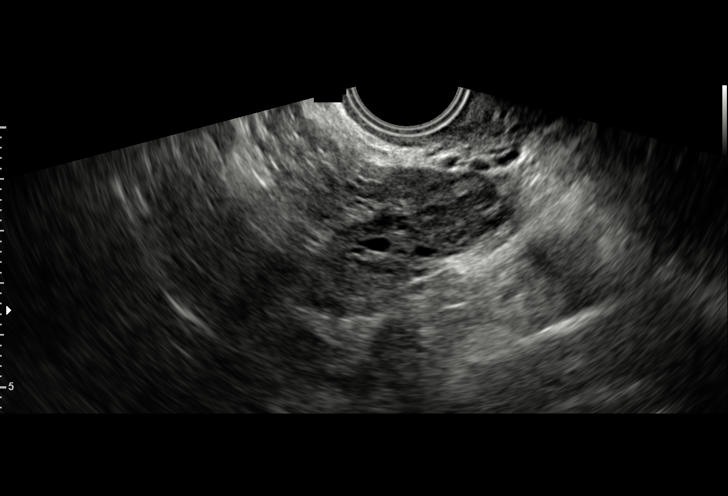
[im 52/62]
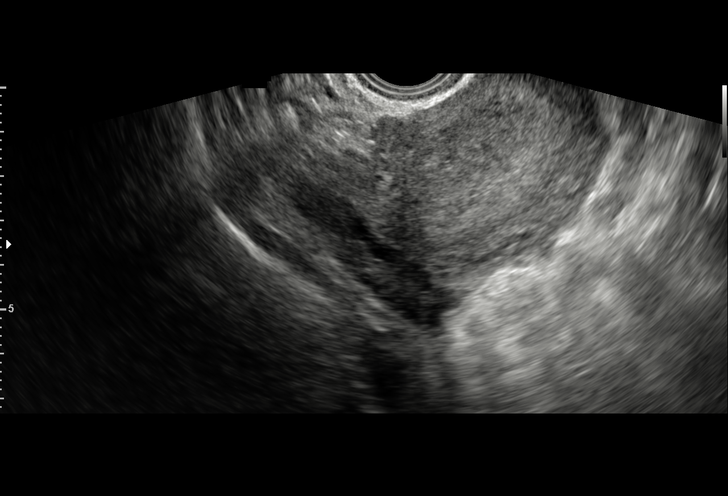
[im 57/62]
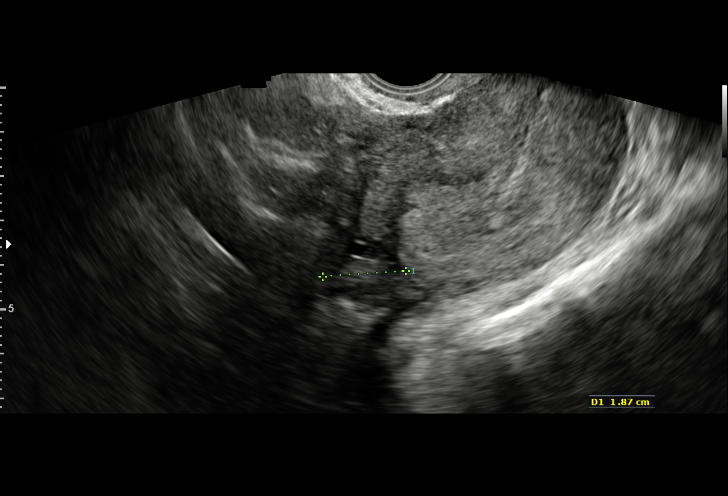
[im 62/62]
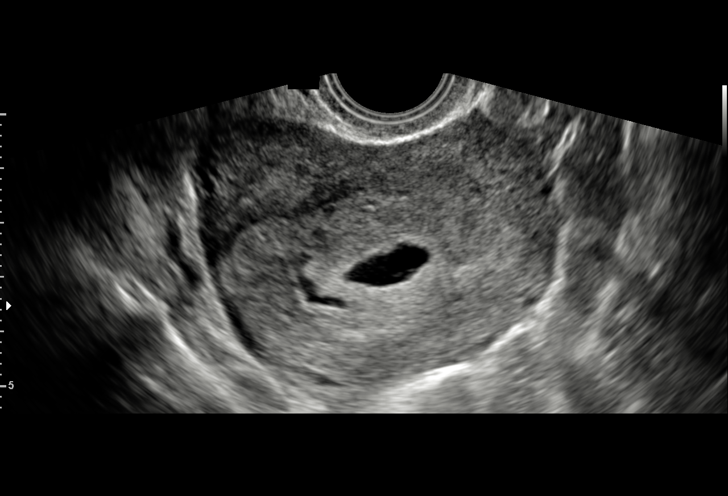

[15 of 28 positions shown; findings below may reference images not displayed]

FINDINGS: Intrauterine gestational sac: Irregular cystic structure within the
endometrial canal of the fundus the uterus.

Yolk sac:  Not identified

Embryo:  Not identified

Cystic structure within the endometrial canal with angular borders.
If this is indeed gestational sac :.

MSD: 11  mm   5 w   2  d

Subchorionic hemorrhage:  None visualized.

Maternal uterus/adnexae: Normal ovaries.  No free fluid,
IMPRESSION: Irregular cystic structure in the endometrial canal may represent
gestational sac. Differential considerations include intrauterine
pregnancy too early to be sonographically visualized, missed
abortion, or ectopic pregnancy. Followup ultrasound is recommended
in 10-14 days for further evaluation.

## 2019-05-17 ENCOUNTER — Ambulatory Visit (INDEPENDENT_AMBULATORY_CARE_PROVIDER_SITE_OTHER): Payer: BC Managed Care – PPO | Admitting: Family Medicine

## 2019-05-17 ENCOUNTER — Other Ambulatory Visit: Payer: Self-pay

## 2019-05-17 ENCOUNTER — Encounter: Payer: Self-pay | Admitting: Family Medicine

## 2019-05-17 VITALS — BP 122/72 | HR 90 | Temp 97.3°F | Resp 14 | Ht 72.0 in | Wt 225.3 lb

## 2019-05-17 DIAGNOSIS — Z6830 Body mass index (BMI) 30.0-30.9, adult: Secondary | ICD-10-CM

## 2019-05-17 DIAGNOSIS — R05 Cough: Secondary | ICD-10-CM | POA: Diagnosis not present

## 2019-05-17 DIAGNOSIS — J329 Chronic sinusitis, unspecified: Secondary | ICD-10-CM

## 2019-05-17 DIAGNOSIS — J309 Allergic rhinitis, unspecified: Secondary | ICD-10-CM

## 2019-05-17 DIAGNOSIS — J45901 Unspecified asthma with (acute) exacerbation: Secondary | ICD-10-CM

## 2019-05-17 DIAGNOSIS — R059 Cough, unspecified: Secondary | ICD-10-CM

## 2019-05-17 DIAGNOSIS — J310 Chronic rhinitis: Secondary | ICD-10-CM | POA: Diagnosis not present

## 2019-05-17 DIAGNOSIS — Z7689 Persons encountering health services in other specified circumstances: Secondary | ICD-10-CM

## 2019-05-17 DIAGNOSIS — E669 Obesity, unspecified: Secondary | ICD-10-CM

## 2019-05-17 DIAGNOSIS — E66811 Obesity, class 1: Secondary | ICD-10-CM

## 2019-05-17 DIAGNOSIS — R062 Wheezing: Secondary | ICD-10-CM | POA: Diagnosis not present

## 2019-05-17 MED ORDER — MONTELUKAST SODIUM 10 MG PO TABS: 10.0000 mg | ORAL_TABLET | Freq: Every day | ORAL | 0 refills | Status: DC

## 2019-05-17 MED ORDER — LEVOCETIRIZINE DIHYDROCHLORIDE 5 MG PO TABS: 5.0000 mg | ORAL_TABLET | Freq: Every evening | ORAL | 2 refills | Status: DC

## 2019-05-17 MED ORDER — PREDNISONE 20 MG PO TABS: 40.0000 mg | ORAL_TABLET | Freq: Every day | ORAL | 0 refills | Status: AC

## 2019-05-17 MED ORDER — ALBUTEROL SULFATE HFA 108 (90 BASE) MCG/ACT IN AERS: 2.0000 | INHALATION_SPRAY | Freq: Four times a day (QID) | RESPIRATORY_TRACT | 0 refills | Status: AC | PRN

## 2019-05-17 NOTE — Progress Notes (Signed)
Name: Nicole Chambers   MRN: [id number]    DOB: Mar 19, 1991   Date:05/17/2019       Progress Note  Chief Complaint  Patient presents with  . New Patient (Initial Visit)  . Asthma    never been dx, but had bad cough 2 weeks ago and seems to flare when around people spoking     Subjective:    Nicole Chambers is a 29 y.o. female, presents to clinicto establish care as a new pt  Presents with SOB and chest discomfort when around second hand smoke, some wheeze.  She does not have hx of asthma  Hx of seasonal allergies and rashes when allergies are worse She is having a lot of worse nasal sx, more bothersome at night, some with nose bleeds She is not on any medicines right now Cough 2 weeks ago, Productive sputum, wheeze, nasal drainage  KED plasma in [place] - she was told she has a lot of "tetanus" and they wanted her to keep donating, as she is concerned about what this means  Sexually active with one female partner 3 years, using condoms   [id number] - early spontaneous abortion    Patient Active Problem List   Diagnosis Date Noted  . Allergic rhinitis 05/23/2019  . Reactive airway disease with acute exacerbation 05/23/2019  . Class 1 obesity without serious comorbidity with body mass index (BMI) of 30.0 to 30.9 in adult 05/23/2019    History reviewed. No pertinent surgical history.  Family History  Problem Relation Age of Onset  . Diabetes Mother     Social History   Socioeconomic History  . Marital status: Single    Spouse name: (boyfriend 3 years)  . Number of children: Not on file  . Years of education: 68  . Highest education level: Not on file  Occupational History  . Not on file  Tobacco Use  . Smoking status: Never Smoker  . Smokeless tobacco: Never Used  . Tobacco comment: tried cigarettes as a teenager  Substance and Sexual Activity  . Alcohol use: Yes    Comment: wine occass 1-2 x a month, sometimes a beer  . Drug use: Never  . Sexual activity: Yes    Birth  control/protection: Condom  Other Topics Concern  . Not on file  Social History Narrative   Lives with boyfriend    Social Determinants of Health   Financial Resource Strain:   . Difficulty of Paying Living Expenses: Not on file  Food Insecurity:   . Worried About Programme researcher, broadcasting/film/video in the Last Year: Not on file  . Ran Out of Food in the Last Year: Not on file  Transportation Needs:   . Lack of Transportation (Medical): Not on file  . Lack of Transportation (Non-Medical): Not on file  Physical Activity:   . Days of Exercise per Week: Not on file  . Minutes of Exercise per Session: Not on file  Stress:   . Feeling of Stress : Not on file  Social Connections:   . Frequency of Communication with Friends and Family: Not on file  . Frequency of Social Gatherings with Friends and Family: Not on file  . Attends Religious Services: Not on file  . Active Member of Clubs or Organizations: Not on file  . Attends Banker Meetings: Not on file  . Marital Status: Not on file  Intimate Partner Violence:   . Fear of Current or Ex-Partner: Not on file  . Emotionally Abused:  Not on file  . Physically Abused: Not on file  . Sexually Abused: Not on file     Current Outpatient Medications:  .  amoxicillin (AMOXIL) 500 MG capsule, Take 500 mg by mouth 4 (four) times daily., Disp: , Rfl:   No Known Allergies  Chart Review Today: I personally reviewed active problem list, medication list, allergies, family history, social history, health maintenance, notes from last encounter, lab results, imaging with the patient/caregiver today.   Review of Systems  Constitutional: Negative.   HENT: Negative.   Eyes: Negative.   Respiratory: Negative.   Cardiovascular: Negative.   Gastrointestinal: Negative.   Endocrine: Negative.   Genitourinary: Negative.   Musculoskeletal: Negative.   Skin: Negative.   Allergic/Immunologic: Negative.   Neurological: Negative.   Hematological:  Negative.   Psychiatric/Behavioral: Negative.   All other systems reviewed and are negative.    Objective:    Vitals:   05/17/19 1545  BP: 122/72  Pulse: 90  Resp: 14  Temp: (!) 97.3 F (36.3 C)  SpO2: 99%  Weight: 225 lb 4.8 oz (102.2 kg)  Height: 6' (1.829 m)    Body mass index is 30.56 kg/m.  Physical Exam Vitals and nursing note reviewed.  Constitutional:      General: She is not in acute distress.    Appearance: She is well-developed. She is not ill-appearing, toxic-appearing or diaphoretic.  HENT:     Head: Normocephalic and atraumatic.     Right Ear: Hearing, tympanic membrane, ear canal and external ear normal.     Left Ear: Hearing, tympanic membrane, ear canal and external ear normal.     Nose: Mucosal edema, congestion and rhinorrhea present.     Mouth/Throat:     Mouth: Mucous membranes are moist. Mucous membranes are not pale.     Pharynx: Uvula midline. No oropharyngeal exudate or uvula swelling.     Tonsils: No tonsillar abscesses.  Eyes:     General:        Right eye: No discharge.        Left eye: No discharge.     Conjunctiva/sclera: Conjunctivae normal.     Pupils: Pupils are equal, round, and reactive to light.  Neck:     Trachea: No tracheal deviation.  Cardiovascular:     Rate and Rhythm: Normal rate and regular rhythm.     Heart sounds: Normal heart sounds.  Pulmonary:     Effort: Pulmonary effort is normal. No respiratory distress.     Breath sounds: No stridor. Wheezing present. No rhonchi or rales.  Abdominal:     General: Bowel sounds are normal. There is no distension.     Palpations: Abdomen is soft.  Musculoskeletal:        General: Normal range of motion.     Cervical back: Normal range of motion and neck supple.  Skin:    General: Skin is warm and dry.     Coloration: Skin is not pale.     Findings: No rash.  Neurological:     Mental Status: She is alert.     Motor: No abnormal muscle tone.     Coordination: Coordination  normal.  Psychiatric:        Behavior: Behavior normal.      PHQ2/9: Depression screen [facility] 2/9 05/17/2019  Decreased Interest 0  Down, Depressed, Hopeless 0  PHQ - 2 Score 0  Altered sleeping 1  Tired, decreased energy 1  Change in appetite 1  Feeling bad or  failure about yourself  0  Trouble concentrating 1  Moving slowly or fidgety/restless 0  Suicidal thoughts 0  PHQ-9 Score 4  Difficult doing work/chores Very difficult    phq 9 is positive ~ 4, reviewed today  Fall Risk: Fall Risk  05/17/2019  Falls in the past year? 0  Number falls in past yr: 0  Injury with Fall? 0    Functional Status Survey: Is the patient deaf or have difficulty hearing?: No Does the patient have difficulty seeing, even when wearing glasses/contacts?: No Does the patient have difficulty concentrating, remembering, or making decisions?: No Does the patient have difficulty walking or climbing stairs?: No Does the patient have difficulty dressing or bathing?: No Does the patient have difficulty doing errands alone such as visiting a doctor's office or shopping?: No   Assessment & Plan:     ICD-10-CM   1. Rhinosinusitis  J31.0    J32.9 levocetirizine (XYZAL) 5 MG tablet    montelukast (SINGULAIR) 10 MG tablet    CANCELED: Novel Coronavirus, NAA ([facility])   start allergy meds, f/up if not improving, sent for COVID testing with recent URI viral sx  2. Allergic rhinitis, unspecified seasonality, unspecified trigger  J30.9 levocetirizine (XYZAL) 5 MG tablet    montelukast (SINGULAIR) 10 MG tablet   rx allergies, avoid triggers, xyzal and singulair  3. Cough  R05 CANCELED: Novel Coronavirus, NAA ([facility])   Productive cough may be bronchitis seriously doubt pneumonia, sent for Covid testing treatment noted below  4. Wheeze  R06.2 predniSONE (DELTASONE) 20 MG tablet    albuterol (VENTOLIN HFA) 108 (90 Base) MCG/ACT inhaler    montelukast (SINGULAIR) 10 MG tablet    CANCELED: Novel Coronavirus, NAA  ([facility])   See #5  5. Reactive airway disease with acute exacerbation, unspecified asthma severity, unspecified whether persistent  J45.901 predniSONE (DELTASONE) 20 MG tablet    albuterol (VENTOLIN HFA) 108 (90 Base) MCG/ACT inhaler    montelukast (SINGULAIR) 10 MG tablet    CANCELED: Novel Coronavirus, NAA ([facility])   Suspected cough wheeze shortness of breath and spasms with specific triggers may be reactive airway with an exacerbation, tx steroids, saba, otc meds  6. Encounter to establish care with new doctor  Z76.89   7. Class 1 obesity without serious comorbidity with body mass index (BMI) of 30.0 to 30.9 in adult, unspecified obesity type  E66.9    Z68.30    Suspected COVID due to URI sx, sent for covid testing, would be cleared to return to clinic only if Covid was negative and symptoms were improving would like to recheck her in the next 2 to 3 weeks  Return for 2-3 week follow up PRN nasal sx and wheeze,  schedule CPE in the next 2-3 months.   Danelle Berry, PA-C 05/17/19 4:06 PM

## 2019-05-18 ENCOUNTER — Ambulatory Visit: Payer: Medicaid Other | Attending: Internal Medicine

## 2019-05-18 DIAGNOSIS — Z20822 Contact with and (suspected) exposure to covid-19: Secondary | ICD-10-CM | POA: Diagnosis not present

## 2019-05-19 LAB — NOVEL CORONAVIRUS, NAA: SARS-CoV-2, NAA: NOT DETECTED

## 2019-05-23 DIAGNOSIS — E669 Obesity, unspecified: Secondary | ICD-10-CM | POA: Insufficient documentation

## 2019-05-23 DIAGNOSIS — J309 Allergic rhinitis, unspecified: Secondary | ICD-10-CM | POA: Insufficient documentation

## 2019-05-23 DIAGNOSIS — J45901 Unspecified asthma with (acute) exacerbation: Secondary | ICD-10-CM | POA: Insufficient documentation

## 2019-05-24 ENCOUNTER — Encounter: Payer: Self-pay | Admitting: Family Medicine

## 2019-05-30 ENCOUNTER — Encounter: Payer: Self-pay | Admitting: Family Medicine

## 2019-06-12 ENCOUNTER — Ambulatory Visit: Payer: Self-pay

## 2019-07-14 ENCOUNTER — Encounter: Payer: BC Managed Care – PPO | Admitting: Family Medicine

## 2020-01-16 ENCOUNTER — Encounter: Payer: Self-pay | Admitting: Family Medicine

## 2020-01-16 ENCOUNTER — Ambulatory Visit: Payer: Self-pay | Admitting: Family Medicine

## 2020-01-16 ENCOUNTER — Other Ambulatory Visit: Payer: Self-pay

## 2020-01-16 DIAGNOSIS — Z113 Encounter for screening for infections with a predominantly sexual mode of transmission: Secondary | ICD-10-CM

## 2020-01-16 LAB — WET PREP FOR TRICH, YEAST, CLUE
Trichomonas Exam: NEGATIVE
Yeast Exam: NEGATIVE

## 2020-01-16 NOTE — Progress Notes (Signed)
[facility] Department STI clinic/screening visit  Subjective:  Nicole Chambers is a 29 y.o. female being seen today for an STI screening visit. The patient reports they do have symptoms.  Patient reports that they do not desire a pregnancy in the next year.   They reported they are not interested in discussing contraception today.  Patient's last menstrual period was 01/10/2020.   Patient has the following medical conditions:   Patient Active Problem List   Diagnosis Date Noted   Allergic rhinitis 05/23/2019   Reactive airway disease with acute exacerbation 05/23/2019   Class 1 obesity without serious comorbidity with body mass index (BMI) of 30.0 to 30.9 in adult 05/23/2019    No chief complaint on file.   HPI  Patient reports x 1 week she has had small amount of brown vaginal discharge.  Her LMP was 01/07/2020.  She denies other symptoms.  Last HIV test per patient/review of record was 07/2018 Patient reports last pap was about 3 years ago.   See flowsheet for further details and programmatic requirements.    The following portions of the patient's history were reviewed and updated as appropriate: allergies, current medications, past medical history, past social history, past surgical history and problem list.  Objective:  There were no vitals filed for this visit.  Physical Exam Vitals and nursing note reviewed.  Constitutional:      Appearance: She is obese.  HENT:     Head: Normocephalic and atraumatic.     Mouth/Throat:     Mouth: Mucous membranes are moist.     Pharynx: Oropharynx is clear. No oropharyngeal exudate or posterior oropharyngeal erythema.  Pulmonary:     Effort: Pulmonary effort is normal.  Abdominal:     General: Abdomen is flat.     Palpations: There is no mass.     Tenderness: There is no abdominal tenderness. There is no rebound.  Genitourinary:    General: Normal vulva.     Exam position: Lithotomy position.     Pubic Area: No  rash or pubic lice.      Labia:        Right: No rash or lesion.        Left: No rash or lesion.      Vagina: Vaginal discharge present. No erythema, bleeding or lesions.     Cervix: No cervical motion tenderness, discharge, friability, lesion or erythema.     Uterus: Normal.      Adnexa: Right adnexa normal and left adnexa normal.     Rectum: Normal.     Comments: Blood tinged discharge noted in vagina. Lymphadenopathy:     Head:     Right side of head: No preauricular or posterior auricular adenopathy.     Left side of head: No preauricular or posterior auricular adenopathy.     Cervical: No cervical adenopathy.     Upper Body:     Right upper body: No supraclavicular or axillary adenopathy.     Left upper body: No supraclavicular or axillary adenopathy.     Lower Body: No right inguinal adenopathy. No left inguinal adenopathy.  Skin:    General: Skin is warm and dry.     Findings: No rash.  Neurological:     Mental Status: She is alert and oriented to person, place, and time.    Assessment and Plan:  Nicole Chambers is a 29 y.o. female presenting to the [facility] Department for STI screening  1. Screening examination for  venereal disease  - WET PREP FOR TRICH, YEAST, CLUE - Chlamydia/Gonorrhea [facility] - HIV [place] [facility] - Syphilis Serology, [facility] she would be contacted if blood work or labs were positive. Discussed that she may have residual blood from last menses.  Client isn't using birth control  Discussed possibility of returning to clinic for FP visit or to see GYN for management.   No follow-ups on file.  No future appointments.  Larene Pickett, FNP

## 2020-01-16 NOTE — Progress Notes (Signed)
Post:  RN reviewed wet mount with patient. No treatment today per S.O. Harvie Heck, RN

## 2020-02-02 DIAGNOSIS — Z23 Encounter for immunization: Secondary | ICD-10-CM | POA: Diagnosis not present

## 2020-04-25 ENCOUNTER — Other Ambulatory Visit: Payer: Self-pay

## 2020-04-25 ENCOUNTER — Ambulatory Visit (INDEPENDENT_AMBULATORY_CARE_PROVIDER_SITE_OTHER): Payer: BC Managed Care – PPO | Admitting: Advanced Practice Midwife

## 2020-04-25 ENCOUNTER — Encounter: Payer: Self-pay | Admitting: Advanced Practice Midwife

## 2020-04-25 ENCOUNTER — Other Ambulatory Visit (HOSPITAL_COMMUNITY)
Admission: RE | Admit: 2020-04-25 | Discharge: 2020-04-25 | Disposition: A | Discharge status: Home / Self Care | Payer: BC Managed Care – PPO | Source: Ambulatory Visit | Attending: Advanced Practice Midwife | Admitting: Advanced Practice Midwife

## 2020-04-25 VITALS — BP 120/70 | Ht 72.0 in | Wt 216.8 lb

## 2020-04-25 DIAGNOSIS — Z01419 Encounter for gynecological examination (general) (routine) without abnormal findings: Secondary | ICD-10-CM | POA: Insufficient documentation

## 2020-04-25 DIAGNOSIS — Z124 Encounter for screening for malignant neoplasm of cervix: Secondary | ICD-10-CM

## 2020-04-25 NOTE — Progress Notes (Signed)
Annual exam

## 2020-04-25 NOTE — Progress Notes (Signed)
Gynecology Annual Exam   PCP: Danelle Berry, PA-C  Chief Complaint:  Chief Complaint  Patient presents with  . Routine Prenatal Visit    Annual exam    History of Present Illness: Patient is a 30 y.o. G1P0 presents for annual exam. The patient has complaint today of irregular menstrual bleeding. For the past 3 months she has had 2 cycles per month lasting 6 days each and of heavy flow. She normally has a monthly cycle of 6 days heavy flow. She admits an increase in stress in the past few months. [facility] bleeding. She had STD testing done there. They suggested she have a PAP smear. We discussed options for treatment including watchful waiting or hormone treatment. She prefers to wait for now and will let me know if she would like to start hormones.   LMP: Patient's last menstrual period was 04/03/2020.  Postcoital Bleeding: no Dysmenorrhea: no  The patient is sexually active. She currently uses none for contraception. "If it happens, it happens". She denies dyspareunia.  The patient does perform self breast exams.  There is no notable family history of breast or ovarian cancer in her family.  The patient wears seatbelts: yes.   The patient has regular exercise: she walks some at her jobs and has a primarily sedentary lifestyle, she tries to eat a healthy diet, she admits increased intake of soda, she does not get enough sleep since she works 2 jobs.    The patient denies current symptoms of depression.  She admits anxiety and copes well.  Review of Systems: Review of Systems  Constitutional: Negative for chills and fever.  HENT: Negative for congestion, ear discharge, ear pain, hearing loss, sinus pain and sore throat.   Eyes: Negative for blurred vision and double vision.  Respiratory: Negative for cough, shortness of breath and wheezing.   Cardiovascular: Negative for chest pain, palpitations and leg swelling.  Gastrointestinal:  Negative for abdominal pain, blood in stool, constipation, diarrhea, heartburn, melena, nausea and vomiting.  Genitourinary: Negative for dysuria, flank pain, frequency, hematuria and urgency.  Musculoskeletal: Negative for back pain, joint pain and myalgias.  Skin: Negative for itching and rash.  Neurological: Negative for dizziness, tingling, tremors, sensory change, speech change, focal weakness, seizures, loss of consciousness, weakness and headaches.  Endo/Heme/Allergies: Negative for environmental allergies. Does not bruise/bleed easily.       Positive for increased frequency of menstrual cycles  Psychiatric/Behavioral: Negative for depression, hallucinations, memory loss, substance abuse and suicidal ideas. The patient is not nervous/anxious and does not have insomnia.        Positive for anxiety    Past Medical History:  Patient Active Problem List   Diagnosis Date Noted  . Allergic rhinitis 05/23/2019  . Reactive airway disease with acute exacerbation 05/23/2019  . Class 1 obesity without serious comorbidity with body mass index (BMI) of 30.0 to 30.9 in adult 05/23/2019    Past Surgical History:  History reviewed. No pertinent surgical history.  Gynecologic History:  Patient's last menstrual period was 04/03/2020. Contraception: none Last Pap: 2 or 3 years ago. Results were: no abnormalities   Obstetric History: G1P0  Family History:  Family History  Problem Relation Age of Onset  . Diabetes Mother     Social History:  Social History   Socioeconomic History  . Marital status: Single    Spouse name: (boyfriend 3 years)  . Number of children: Not on file  .  Years of education: 53  . Highest education level: Not on file  Occupational History  . Not on file  Tobacco Use  . Smoking status: Never Smoker  . Smokeless tobacco: Never Used  . Tobacco comment: tried cigarettes as a teenager  Vaping Use  . Vaping Use: Never used  Substance and Sexual Activity  .  Alcohol use: Yes    Comment: wine occass 1-2 x a month, sometimes a beer  . Drug use: Never  . Sexual activity: Yes    Birth control/protection: Condom  Other Topics Concern  . Not on file  Social History Narrative   Lives with boyfriend    Social Determinants of Health   Financial Resource Strain: Not on file  Food Insecurity: Not on file  Transportation Needs: Not on file  Physical Activity: Not on file  Stress: Not on file  Social Connections: Not on file  Intimate Partner Violence: Not on file    Allergies:  No Known Allergies  Medications: Prior to Admission medications   Medication Sig Start Date End Date Taking? Authorizing Provider  albuterol (VENTOLIN HFA) 108 (90 Base) MCG/ACT inhaler Inhale 2 puffs into the lungs every 6 (six) hours as needed for wheezing or shortness of breath (chest tightness, coughing fit). 05/17/19  Yes Danelle Berry, PA-C    Physical Exam Vitals: Blood pressure 120/70, height 6' (1.829 m), weight 216 lb 12.8 oz (98.3 kg), last menstrual period 04/03/2020, unknown if currently breastfeeding.  General: NAD HEENT: normocephalic, anicteric Thyroid: no enlargement, no palpable nodules Pulmonary: No increased work of breathing, CTAB Cardiovascular: RRR, distal pulses 2+ Breast: Breast symmetrical, no tenderness, no palpable nodules or masses, no skin or nipple retraction present, no nipple discharge.  No axillary or supraclavicular lymphadenopathy. Abdomen: NABS, soft, non-tender, non-distended.  Umbilicus without lesions.  No hepatomegaly, splenomegaly or masses palpable. No evidence of hernia  Genitourinary:  External: Normal external female genitalia.  Normal urethral meatus, normal Bartholin's and Skene's glands.    Vagina: Normal vaginal mucosa, no evidence of prolapse.    Cervix: Grossly normal in appearance, no bleeding  Uterus: Non-enlarged, mobile, normal contour.  No CMT  Adnexa: ovaries non-enlarged, no adnexal masses  Rectal:  deferred  Lymphatic: no evidence of inguinal lymphadenopathy Extremities: no edema, erythema, or tenderness Neurologic: Grossly intact Psychiatric: mood appropriate, affect full   Assessment: 30 y.o. G1P0 routine annual exam  Plan: Problem List Items Addressed This Visit   None   Visit Diagnoses    Well woman exam with routine gynecological exam    -  Primary   Relevant Orders   Cytology - PAP   Screening for cervical cancer       Relevant Orders   Cytology - PAP      1) STI screening  was offered and declined  2)  ASCCP guidelines and rationale discussed.  Patient opts for every 3 years screening interval  3) Contraception - the patient is currently using  none.  She is "if it happens, it happens"  4) Routine healthcare maintenance including cholesterol, diabetes screening discussed Declines   5) Irregular cycles: follow up as needed for hormone treatment   6) Return in about 1 year (around 04/25/2021) for annual established gyn.   Tresea Mall, CNM [facility] 04/25/2020, 2:07 PM

## 2020-04-25 NOTE — Patient Instructions (Signed)
Managing Stress, Adult Feeling a certain amount of stress is normal. Stress helps our body and mind get ready to deal with the demands of life. Stress hormones can motivate you to do well at work and meet your responsibilities. However severe or Ranta-lasting (chronic) stress can affect your mental and physical health. Chronic stress puts you at higher risk for anxiety, depression, and other health problems like digestive problems, muscle aches, heart disease, high blood pressure, and stroke. What are the causes? Common causes of stress include:  Demands from work, such as deadlines, feeling overworked, or having [place] hours.  Pressures at home, such as money issues, disagreements with a spouse, or parenting issues.  Pressures from major life changes, such as divorce, moving, loss of a loved one, or chronic illness. You may be at higher risk for stress-related problems if you do not get enough sleep, are in poor health, do not have emotional support, or have a mental health disorder like anxiety or depression. How to recognize stress Stress can make you:  Have trouble sleeping.  Feel sad, anxious, irritable, or overwhelmed.  Lose your appetite.  Overeat or want to eat unhealthy foods.  Want to use drugs or alcohol. Stress can also cause physical symptoms, such as:  Sore, tense muscles, especially in the shoulders and neck.  Headaches.  Trouble breathing.  A faster heart rate.  Stomach pain, nausea, or vomiting.  Diarrhea or constipation.  Trouble concentrating. Follow these instructions at home: Lifestyle  Identify the source of your stress and your reaction to it. See a therapist who can help you change your reactions.  When there are stressful events: ? Talk about it with family, friends, or co-workers. ? Try to think realistically about stressful events and not ignore them or overreact. ? Try to find the positives in a stressful situation and not focus on the  negatives. ? Cut back on responsibilities at work and home, if possible. Ask for help from friends or family members if you need it.  Find ways to cope with stress, such as: ? Meditation. ? Deep breathing. ? Yoga or tai chi. ? Progressive muscle relaxation. ? Doing art, playing music, or reading. ? Making time for fun activities. ? Spending time with family and friends.  Get support from family, friends, or spiritual resources. Eating and drinking  Eat a healthy diet. This includes: ? Eating foods that are high in fiber, such as beans, whole grains, and fresh fruits and vegetables. ? Limiting foods that are high in fat and processed sugars, such as fried and sweet foods.  Do not skip meals or overeat.  Drink enough fluid to keep your urine pale yellow. Alcohol use  Do not drink alcohol if: ? Your health care provider tells you not to drink. ? You are pregnant, may be pregnant, or are planning to become pregnant.  Drinking alcohol is a way some people try to ease their stress. This can be dangerous, so if you drink alcohol: ? Limit how much you use to:  0-1 drink a day for women.  0-2 drinks a day for men. ? Be aware of how much alcohol is in your drink. In the [place] one drink equals one 12 oz bottle of beer (355 mL), one 5 oz glass of wine (148 mL), or one 1 oz glass of hard liquor (44 mL). Activity  Include 30 minutes of exercise in your daily schedule. Exercise is a good stress reducer.  Include time in your day for   an activity that you find relaxing. Try taking a walk, going on a bike ride, reading a book, or listening to music.  Schedule your time in a way that lowers stress, and keep a consistent schedule. Prioritize what is most important to get done.   General instructions  Get enough sleep. Try to go to sleep and get up at about the same time every day.  Take over-the-counter and prescription medicines only as told by your health care provider.  Do not use any  products that contain nicotine or tobacco, such as cigarettes, e-cigarettes, and chewing tobacco. If you need help quitting, ask your health care provider.  Do not use drugs or smoke to cope with stress.  Keep all follow-up visits as told by your health care provider. This is important. Where to find support  Talk with your health care provider about stress management or finding a support group.  Find a therapist to work with you on your stress management techniques. Contact a health care provider if:  Your stress symptoms get worse.  You are unable to manage your stress at home.  You are struggling to stop using drugs or alcohol. Get help right away if:  You may be a danger to yourself or others.  You have any thoughts of death or suicide. If you ever feel like you may hurt yourself or others, or have thoughts about taking your own life, get help right away. You can go to your nearest emergency department or call:  Your local emergency services (911 in the [place]  A suicide crisis helpline, such as the National Suicide Prevention Lifeline at 1-800-273-8255. This is open 24 hours a day. Summary  Feeling a certain amount of stress is normal, but severe or Ilic-lasting (chronic) stress can affect your mental and physical health.  Chronic stress can put you at higher risk for anxiety, depression, and other health problems like digestive problems, muscle aches, heart disease, high blood pressure, and stroke.  You may be at higher risk for stress-related problems if you do not get enough sleep, are in poor health, lack emotional support, or have a mental health disorder like anxiety or depression.  Identify the source of your stress and your reaction to it. Try talking about stressful events with family, friends, or co-workers, finding a coping method, or getting support from spiritual resources.  If you need more help, talk with your health care provider about finding a support group  or a mental health therapist. This information is not intended to replace advice given to you by your health care provider. Make sure you discuss any questions you have with your health care provider. Document Revised: 10/26/2018 Document Reviewed: 10/26/2018 Elsevier Patient Education  2021 Elsevier Inc.  

## 2020-04-26 LAB — CYTOLOGY - PAP: Diagnosis: NEGATIVE

## 2020-05-08 ENCOUNTER — Other Ambulatory Visit: Payer: Self-pay | Admitting: Advanced Practice Midwife

## 2020-05-08 DIAGNOSIS — N920 Excessive and frequent menstruation with regular cycle: Secondary | ICD-10-CM

## 2020-05-08 MED ORDER — NORGESTIMATE-ETH ESTRADIOL 0.25-35 MG-MCG PO TABS: 1.0000 | ORAL_TABLET | Freq: Every day | ORAL | 3 refills | Status: DC

## 2020-05-08 NOTE — Progress Notes (Signed)
Rx sprintec sent per patient request.

## 2020-08-28 ENCOUNTER — Other Ambulatory Visit: Payer: Self-pay

## 2020-08-28 ENCOUNTER — Ambulatory Visit (INDEPENDENT_AMBULATORY_CARE_PROVIDER_SITE_OTHER): Payer: BC Managed Care – PPO | Admitting: Dermatology

## 2020-08-28 DIAGNOSIS — L700 Acne vulgaris: Secondary | ICD-10-CM

## 2020-08-28 DIAGNOSIS — L810 Postinflammatory hyperpigmentation: Secondary | ICD-10-CM

## 2020-08-28 MED ORDER — DROSPIRENONE-ETHINYL ESTRADIOL 3-0.02 MG PO TABS: 1.0000 | ORAL_TABLET | Freq: Every day | ORAL | 6 refills | Status: DC

## 2020-08-28 NOTE — Progress Notes (Signed)
   New Patient Visit  Subjective  Nicole Chambers is a 30 y.o. female who presents for the following: Acne (Pt c/o acne on face, back, chest, and some on arms. She treats with a benzoyl peroxide and a cerave wash. She has used doxycycline and differin in the past which helped at the time. ). But acne came right back after she stopped it.  She has been having problems with acne for years. Currently not on birth control.  She has taken it in the past.  No family h/o breast cancer, denies smoking.  Objective  Well appearing patient in no apparent distress; mood and affect are within normal limits.  A focused examination was performed including face, chest, back, shoulders. Relevant physical exam findings are noted in the Assessment and Plan.  Objective  Head - Anterior (Face): Multiple comedones with hyperpigmentation on chest and shoulders, some on arms and cheeks.  Inflammatory papules on chin, cheeks, forehead.  Assessment & Plan  Acne vulgaris Head - Anterior (Face)  With PIH. Chronic, with recurrence off oral antibiotics/topicals.   Discussed Accutane treatment course. Consents signed.  Informed pt that she would need to be on a birth control. Start Yaz. Take one pill by mouth daily as directed.  Recommend daily broad spectrum sunscreen SPF 30+ to sun-exposed areas, reapply every 2 hours as needed.  Discussed adding topical fade cream later once acne breakouts lessen  Reviewed potential side effects of isotretinoin including xerosis, cheilitis, hepatitis, hyperlipidemia, and severe birth defects if taken by a pregnant woman. Reviewed reports of suicidal ideation in those with a history of depression while taking isotretinoin and reports of diagnosis of inflammatory bowl disease while taking isotretinoin as well as the lack of evidence for a causal relationship between isotretinoin, depression and IBD. Patient advised to reach out with any questions or concerns. Patient advised not to  share pills or donate blood while on treatment or for one month after completing treatment.   Urine pregnancy test performed in office today and was negative.  Patient demonstrates comprehension and confirms she will not get pregnant.   Pt registered in iPledge # [id number]   drospirenone-ethinyl estradiol (YAZ) 3-0.02 MG tablet - Head - Anterior (Face)  Return in about 1 month (around 09/28/2020) for accutane start.   I, Epifania Gore, CMA, am acting as scribe for Willeen Niece, MD.  Documentation: I have reviewed the above documentation for accuracy and completeness, and I agree with the above.  Willeen Niece MD

## 2020-10-02 ENCOUNTER — Other Ambulatory Visit: Payer: Self-pay

## 2020-10-02 ENCOUNTER — Ambulatory Visit (INDEPENDENT_AMBULATORY_CARE_PROVIDER_SITE_OTHER): Payer: BC Managed Care – PPO | Admitting: Dermatology

## 2020-10-02 DIAGNOSIS — L700 Acne vulgaris: Secondary | ICD-10-CM

## 2020-10-02 DIAGNOSIS — L810 Postinflammatory hyperpigmentation: Secondary | ICD-10-CM

## 2020-10-02 NOTE — Progress Notes (Signed)
   Follow-Up Visit   Subjective  Nicole Chambers is a 30 y.o. female who presents for the following: Acne (Here to start Accutane therapy. Has been spotting since starting Yaz. ).    The following portions of the chart were reviewed this encounter and updated as appropriate:      Review of Systems: No other skin or systemic complaints except as noted in HPI or Assessment and Plan.   Objective  Well appearing patient in no apparent distress; mood and affect are within normal limits.  A focused examination was performed including face, neck, chest and back. Relevant physical exam findings are noted in the Assessment and Plan.  Head - Anterior (Face) Some inflammatory papules and hyperpigmented macules on cheeks, closed comedones on forehead, closed comedones and hyperpigmented macules on chest and back.   Assessment & Plan  Acne vulgaris Head - Anterior (Face)  Severe acne with PIH  Start Isotretinoin today  Reviewed potential side effects of isotretinoin including xerosis, cheilitis, hepatitis, hyperlipidemia, and severe birth defects if taken by a pregnant woman. Reviewed reports of suicidal ideation in those with a history of depression while taking isotretinoin and reports of diagnosis of inflammatory bowl disease while taking isotretinoin as well as the lack of evidence for a causal relationship between isotretinoin, depression and IBD. Patient advised to reach out with any questions or concerns. Patient advised not to share pills or donate blood while on treatment or for one month after completing treatment.   iPledge: [id number] 2 forms of BC: Yaz/OCP, Female latex condoms Weight: 216  Plan Absorica 30mg  1 po qd with food, pending normal labs and negative pregnancy test. [facility] Sunscreen to face, chest, back when outdoors  Recommend f/u with GYN to discuss spotting while on BCP.   Related Procedures hCG, serum, qualitative Lipid panel Comprehensive metabolic  panel  Related Medications drospirenone-ethinyl estradiol (YAZ) 3-0.02 MG tablet Take 1 tablet by mouth daily.  Return in about 1 month (around 11/01/2020) for Accutane recheck.  I, 11/03/2020, CMA, am acting as scribe for Lawson Radar, MD.  Documentation: I have reviewed the above documentation for accuracy and completeness, and I agree with the above.  Willeen Niece MD

## 2020-10-02 NOTE — Patient Instructions (Addendum)
Reviewed potential side effects of isotretinoin including xerosis, cheilitis, hepatitis, hyperlipidemia, and severe birth defects if taken by a pregnant woman. Reviewed reports of suicidal ideation in those with a history of depression while taking isotretinoin and reports of diagnosis of inflammatory bowl disease while taking isotretinoin as well as the lack of evidence for a causal relationship between isotretinoin, depression and IBD. Patient advised to reach out with any questions or concerns. Patient advised not to share pills or donate blood while on treatment or for one month after completing treatment.   Recommend daily broad spectrum sunscreen SPF 30+ to sun-exposed areas, reapply every 2 hours as needed. Call for new or changing lesions.  Staying in the shade or wearing [place] sleeves, sun glasses (UVA+UVB protection) and wide brim hats (4-inch brim around the entire circumference of the hat) are also recommended for sun protection.      If you have any questions or concerns for your doctor, please call our main line at [phone] and press option 4 to reach your doctor's medical assistant. If no one answers, please leave a voicemail as directed and we will return your call as soon as possible. Messages left after 4 pm will be answered the following business day.   You may also send us a message via MyChart. We typically respond to MyChart messages within 1-2 business days.  For prescription refills, please ask your pharmacy to contact our office. Our fax number is [phone].  If you have an urgent issue when the clinic is closed that cannot wait until the next business day, you can page your doctor at the number below.    Please note that while we do our best to be available for urgent issues outside of office hours, we are not available 24/7.   If you have an urgent issue and are unable to reach us, you may choose to seek medical care at your doctor's office, retail clinic, urgent  care center, or emergency room.  If you have a medical emergency, please immediately call 911 or go to the emergency department.  Pager Numbers  - Dr. Kowalski: [phone]  - Dr. Moye: [phone]  - Dr. Stewart: [phone]  In the event of inclement weather, please call our main line at [phone] for an update on the status of any delays or closures.  Dermatology Medication Tips: Please keep the boxes that topical medications come in in order to help keep track of the instructions about where and how to use these. Pharmacies typically print the medication instructions only on the boxes and not directly on the medication tubes.   If your medication is too expensive, please contact our office at [phone] option 4 or send us a message through MyChart.   We are unable to tell what your co-pay for medications will be in advance as this is different depending on your insurance coverage. However, we may be able to find a substitute medication at lower cost or fill out paperwork to get insurance to cover a needed medication.   If a prior authorization is required to get your medication covered by your insurance company, please allow us 1-2 business days to complete this process.  Drug prices often vary depending on where the prescription is filled and some pharmacies may offer cheaper prices.  The website www.goodrx.com contains coupons for medications through different pharmacies. The prices here do not account for what the cost may be with help from insurance (it may be cheaper with your insurance), but the   website can give you the price if you did not use any insurance.  - You can print the associated coupon and take it with your prescription to the pharmacy.  - You may also stop by our office during regular business hours and pick up a GoodRx coupon card.  - If you need your prescription sent electronically to a different pharmacy, notify our office through [facility] MyChart or  by phone at [phone] option 4.  

## 2020-10-03 LAB — COMPREHENSIVE METABOLIC PANEL
ALT: 13 IU/L (ref 0–32)
AST: 11 IU/L (ref 0–40)
Albumin/Globulin Ratio: 1.4 (ref 1.2–2.2)
Albumin: 4.2 g/dL (ref 3.9–5.0)
Alkaline Phosphatase: 66 IU/L (ref 44–121)
BUN/Creatinine Ratio: 15 (ref 9–23)
BUN: 10 mg/dL (ref 6–20)
Bilirubin Total: 0.2 mg/dL (ref 0.0–1.2)
CO2: 17 mmol/L — ABNORMAL LOW (ref 20–29)
Calcium: 9.2 mg/dL (ref 8.7–10.2)
Chloride: 99 mmol/L (ref 96–106)
Creatinine, Ser: 0.66 mg/dL (ref 0.57–1.00)
Globulin, Total: 2.9 g/dL (ref 1.5–4.5)
Glucose: 270 mg/dL — ABNORMAL HIGH (ref 65–99)
Potassium: 4.2 mmol/L (ref 3.5–5.2)
Sodium: 137 mmol/L (ref 134–144)
Total Protein: 7.1 g/dL (ref 6.0–8.5)
eGFR: 122 mL/min/{1.73_m2} (ref >59)

## 2020-10-03 LAB — LIPID PANEL
Chol/HDL Ratio: 4.2 ratio (ref 0.0–4.4)
Cholesterol, Total: 250 mg/dL — ABNORMAL HIGH (ref 100–199)
HDL: 60 mg/dL (ref >39)
LDL Chol Calc (NIH): 144 mg/dL — ABNORMAL HIGH (ref 0–99)
Triglycerides: 258 mg/dL — ABNORMAL HIGH (ref 0–149)
VLDL Cholesterol Cal: 46 mg/dL — ABNORMAL HIGH (ref 5–40)

## 2020-10-03 LAB — HCG, SERUM, QUALITATIVE: hCG,Beta Subunit,Qual,Serum: NEGATIVE m[IU]/mL (ref <6)

## 2020-10-04 ENCOUNTER — Encounter: Payer: Self-pay | Admitting: Dermatology

## 2020-10-07 ENCOUNTER — Telehealth: Payer: Self-pay

## 2020-10-07 MED ORDER — ISOTRETINOIN 20 MG PO CAPS: 20.0000 mg | ORAL_CAPSULE | Freq: Every day | ORAL | 0 refills | Status: DC

## 2020-10-07 NOTE — Telephone Encounter (Signed)
-----   Message from Willeen Niece, MD sent at 10/07/2020 10:12 AM EDT ----- High Glucose (270) and high Tgs (258), Pregnancy negative. Was patient fasting for test?  Doe she have h/o diabetes?  I will recommend we check her labs more frequently during treatment course, and will add a HBA1c to next check.  She will need to be fasting when she gets her labs.  Will send in Absorica 20 mg PO qd and recheck labs next month- please call patient

## 2020-10-07 NOTE — Telephone Encounter (Signed)
Advised pt of labs.  She was fasting and does not have a history of diabetes.  Advised pt Dr. Roseanne Reno recommended more frequent lab checks during Isotretinoin course.  Patient confirmed in [facility] program.   Pt was advised to go into [facility] program and demonstrate comprehension.  Absorica 20mg  1 po qd #30 0rf sent to [address] on [address]sh

## 2020-10-08 ENCOUNTER — Other Ambulatory Visit: Payer: Self-pay | Admitting: Advanced Practice Midwife

## 2020-10-08 DIAGNOSIS — N920 Excessive and frequent menstruation with regular cycle: Secondary | ICD-10-CM

## 2020-10-08 MED ORDER — NORGESTIMATE-ETH ESTRADIOL 0.25-35 MG-MCG PO TABS: 1.0000 | ORAL_TABLET | Freq: Every day | ORAL | 3 refills | Status: DC

## 2020-10-08 NOTE — Progress Notes (Signed)
Rx sprintec re-sent. Yaz discontinued. Message with instructions sent to patient.

## 2020-10-18 ENCOUNTER — Ambulatory Visit (INDEPENDENT_AMBULATORY_CARE_PROVIDER_SITE_OTHER): Payer: BC Managed Care – PPO | Admitting: Family Medicine

## 2020-10-18 ENCOUNTER — Encounter: Payer: Self-pay | Admitting: Family Medicine

## 2020-10-18 ENCOUNTER — Other Ambulatory Visit: Payer: Self-pay

## 2020-10-18 VITALS — BP 112/68 | HR 96 | Temp 98.9°F | Resp 14 | Ht 72.0 in | Wt 218.7 lb

## 2020-10-18 DIAGNOSIS — E119 Type 2 diabetes mellitus without complications: Secondary | ICD-10-CM | POA: Diagnosis not present

## 2020-10-18 DIAGNOSIS — R739 Hyperglycemia, unspecified: Secondary | ICD-10-CM | POA: Diagnosis not present

## 2020-10-18 DIAGNOSIS — E1165 Type 2 diabetes mellitus with hyperglycemia: Secondary | ICD-10-CM

## 2020-10-18 LAB — GLUCOSE, POCT (MANUAL RESULT ENTRY): POC Glucose: 280 mg/dl — AB (ref 70–99)

## 2020-10-18 NOTE — Progress Notes (Signed)
Patient ID: Nicole Chambers, female    DOB: January 28, 1991, 28 y.o.   MRN: [id number]  PCP: Danelle Berry, PA-C  Chief Complaint  Patient presents with   lab review    Subjective:   Nicole Chambers is a 30 y.o. female, presents to clinic with CC of the following:  HPI  Blood done with dermatology revealed glucose of 270, no hx of dm No results found for: HGBA1C No prior A1C Her mother has DM, no other chemistry and glucose found in records Random POC CBG here today is elevated as well Results for orders placed or performed in visit on 10/18/20  POCT Glucose (CBG)  Result Value Ref Range   POC Glucose 280 (A) 70 - 99 mg/dl   She did eat at [employer] today and drank a cheerwine She notes frequent urination, feeling thirsty, no weight loss    Patient Active Problem List   Diagnosis Date Noted   Allergic rhinitis 05/23/2019   Reactive airway disease with acute exacerbation 05/23/2019   Class 1 obesity without serious comorbidity with body mass index (BMI) of 30.0 to 30.9 in adult 05/23/2019      Current Outpatient Medications:    albuterol (VENTOLIN HFA) 108 (90 Base) MCG/ACT inhaler, Inhale 2 puffs into the lungs every 6 (six) hours as needed for wheezing or shortness of breath (chest tightness, coughing fit)., Disp: 18 g, Rfl: 0   ISOtretinoin (ABSORICA) 20 MG capsule, Take 1 capsule (20 mg total) by mouth daily., Disp: 30 capsule, Rfl: 0   norgestimate-ethinyl estradiol (ORTHO-CYCLEN) 0.25-35 MG-MCG tablet, Take 1 tablet by mouth daily., Disp: 90 tablet, Rfl: 3   No Known Allergies   Social History   Tobacco Use   Smoking status: Never   Smokeless tobacco: Never   Tobacco comments:    tried cigarettes as a teenager  Vaping Use   Vaping Use: Never used  Substance Use Topics   Alcohol use: Yes    Comment: wine occass 1-2 x a month, sometimes a beer   Drug use: Never      Chart Review Today: I personally reviewed active problem list, medication list, allergies,  family history, social history, health maintenance, notes from last encounter, lab results, imaging with the patient/caregiver today.   Review of Systems  Constitutional: Negative.   HENT: Negative.    Eyes: Negative.   Respiratory: Negative.    Cardiovascular: Negative.   Gastrointestinal: Negative.   Endocrine: Negative.   Genitourinary: Negative.   Musculoskeletal: Negative.   Skin: Negative.   Allergic/Immunologic: Negative.   Neurological: Negative.   Hematological: Negative.   Psychiatric/Behavioral: Negative.    All other systems reviewed and are negative.     Objective:   Vitals:   10/18/20 1141  BP: 112/68  Pulse: 96  Resp: 14  Temp: 98.9 F (37.2 C)  SpO2: 99%  Weight: 218 lb 11.2 oz (99.2 kg)  Height: 6' (1.829 m)    Body mass index is 29.66 kg/m.  Physical Exam Vitals and nursing note reviewed.  Constitutional:      General: She is not in acute distress.    Appearance: Normal appearance. She is well-developed. She is obese. She is not ill-appearing, toxic-appearing or diaphoretic.     Interventions: Face mask in place.  HENT:     Head: Normocephalic and atraumatic.     Right Ear: External ear normal.     Left Ear: External ear normal.     Mouth/Throat:     Mouth: Mucous  membranes are moist.     Pharynx: Oropharynx is clear.  Eyes:     General: Lids are normal. No scleral icterus.       Right eye: No discharge.        Left eye: No discharge.     Conjunctiva/sclera: Conjunctivae normal.  Neck:     Trachea: Phonation normal. No tracheal deviation.  Cardiovascular:     Rate and Rhythm: Normal rate and regular rhythm.     Pulses: Normal pulses.          Radial pulses are 2+ on the right side and 2+ on the left side.       Posterior tibial pulses are 2+ on the right side and 2+ on the left side.     Heart sounds: Normal heart sounds. No murmur heard.   No friction rub. No gallop.  Pulmonary:     Effort: Pulmonary effort is normal. No respiratory  distress.     Breath sounds: Normal breath sounds. No stridor. No wheezing, rhonchi or rales.  Chest:     Chest wall: No tenderness.  Abdominal:     General: Bowel sounds are normal. There is no distension.     Palpations: Abdomen is soft.  Skin:    General: Skin is warm and dry.     Coloration: Skin is not jaundiced or pale.     Findings: No rash.  Neurological:     Mental Status: She is alert.     Motor: No abnormal muscle tone.     Gait: Gait normal.  Psychiatric:        Mood and Affect: Mood normal.        Speech: Speech normal.        Behavior: Behavior normal.     Results for orders placed or performed in visit on 10/02/20  hCG, serum, qualitative  Result Value Ref Range   hCG,Beta Subunit,Qual,Serum Negative Negative <6 mIU/mL  Lipid panel  Result Value Ref Range   Cholesterol, Total 250 (H) 100 - 199 mg/dL   Triglycerides 258 (H) 0 - 149 mg/dL   HDL 60 >39 mg/dL   VLDL Cholesterol Cal 46 (H) 5 - 40 mg/dL   LDL Chol Calc (NIH) 144 (H) 0 - 99 mg/dL   Chol/HDL Ratio 4.2 0.0 - 4.4 ratio  Comprehensive metabolic panel  Result Value Ref Range   Glucose 270 (H) 65 - 99 mg/dL   BUN 10 6 - 20 mg/dL   Creatinine, Ser 0.66 0.57 - 1.00 mg/dL   eGFR 122 >59 mL/min/1.73   BUN/Creatinine Ratio 15 9 - 23   Sodium 137 134 - 144 mmol/L   Potassium 4.2 3.5 - 5.2 mmol/L   Chloride 99 96 - 106 mmol/L   CO2 17 (L) 20 - 29 mmol/L   Calcium 9.2 8.7 - 10.2 mg/dL   Total Protein 7.1 6.0 - 8.5 g/dL   Albumin 4.2 3.9 - 5.0 g/dL   Globulin, Total 2.9 1.5 - 4.5 g/dL   Albumin/Globulin Ratio 1.4 1.2 - 2.2   Bilirubin Total 0.2 0.0 - 1.2 mg/dL   Alkaline Phosphatase 66 44 - 121 IU/L   AST 11 0 - 40 IU/L   ALT 13 0 - 32 IU/L       Assessment & Plan:     ICD-10-CM   1. Hyperglycemia  R73.9 Hemoglobin A1c    POCT Glucose (CBG)     Sugar high again today, but pt just ate and drank cheer wine,  A1C done and pending Advised pt to cut back on sugary drinks and food, gave glucometer  reviewed how to use and advised to occasionally check over the next couple weeks fasting blood sugar - f/up and tx pending A1c results Reviewed and educated pt about prediabetes and diabetes All questions asked and answered  F/up if meds are needed with new onset DM dx - up to pt preference, virtual or in person or can start metformin and work on diet/lifestyle and then f/up in 3 months in office -      Delsa Grana, PA-C 10/18/20 11:50 AM

## 2020-10-18 NOTE — Patient Instructions (Signed)
Hyperglycemia Hyperglycemia is when the sugar (glucose) level in your blood is too high. High blood sugar can happen to people who have or do not have diabetes. High blood sugar can happen quickly.  What are the causes? If you have diabetes, high blood sugar may be caused by: Medicines that increase blood sugar or affect your control of diabetes. Getting less physical activity. Overeating. Being sick or injured or having an infection. Having surgery. Stress. Not giving yourself enough insulin (if you are taking it). You may have high blood sugar because you have diabetes that has not beendiagnosed yet. If you do not have diabetes, high blood sugar may be caused by: Certain medicines. Stress. A bad illness. An infection. Having surgery. Diseases of the pancreas. What increases the risk? This condition is more likely to develop in people who have risk factors for diabetes, such as: Having a family member with diabetes. Certain conditions in which the body's defense system (immune system) attacks itself. These are called autoimmune disorders. Being overweight. Not being active. Having a condition called insulin resistance. Having a history of: Prediabetes. Diabetes when pregnant. Polycystic ovarian syndrome (PCOS). What are the signs or symptoms? This condition may not cause symptoms. If you do have symptoms, they may include: Feeling more thirsty than normal. Needing to pee (urinate) more often than normal. Hunger. Feeling very tired. Blurry eyesight (vision). You may get other symptoms as the condition gets worse, such as: Dry mouth. Pain in your belly (abdomen). Not being hungry (loss of appetite). Breath that smells fruity. Weakness. Weight loss that is not planned. A tingling or numb feeling in your hands or feet. A headache. Cuts or bruises that heal slowly. How is this treated? Treatment depends on the cause of your condition. Treatment may include: Taking  medicine to control your blood sugar levels. Changing your medicine or dosage if you take insulin or other diabetes medicines. Lifestyle changes. These may include: Exercising more. Eating healthier foods. Losing weight. Treating an illness or infection. Checking your blood sugar more often. Stopping or reducing steroid medicines. If your condition gets very bad, you will need to be treated in the hospital. Follow these instructions at home: General instructions Take over-the-counter and prescription medicines only as told by your doctor. Do not smoke or use any products that contain nicotine or tobacco. If you need help quitting, ask your doctor. If you drink alcohol: Limit how much you have to: 0-1 drink a day for women who are not pregnant. 0-2 drinks a day for men. Know how much alcohol is in a drink. In the [place] one drink equals one 12 oz bottle of beer (355 mL), one 5 oz glass of wine (148 mL), or one 1 oz glass of hard liquor (44 mL). Manage stress. If you need help with this, ask your doctor. Do exercises as told by your doctor. Keep all follow-up visits. Eating and drinking  Stay at a healthy weight. Make sure you drink enough fluid when you: Exercise. Get sick. Are in hot temperatures. Drink enough fluid to keep your pee (urine) pale yellow.  If you have diabetes:  Know the symptoms of high blood sugar. Follow your diabetes management plan as told by your doctor. Make sure you: Take insulin and medicines as told. Follow your exercise plan. Follow your meal plan. Eat on time. Do not skip meals. Check your blood sugar as often as told. Make sure you check before and after exercise. If you exercise longer or in  a different way, check your blood sugar more often. Follow your sick day plan whenever you cannot eat or drink normally. Make this plan ahead of time with your doctor. Share your diabetes management plan with people in your workplace, school, and  household. Check your pee for ketones when you are ill and as told by your doctor. Carry a card or wear jewelry that says that you have diabetes.  Where to find more information American Diabetes Association: www.diabetes.org Contact a doctor if: Your blood sugar level is at or above 240 mg/dL (53.6 mmol/L) for 2 days in a row. You have problems keeping your blood sugar in your target range. You have high blood pressure often. You have signs of illness, such as: Feeling like you may vomit (feeling nauseous). Vomiting. A fever. Get help right away if: Your blood sugar monitor reads "high" even when you are taking insulin. You have trouble breathing. You have a change in how you think, feel, or act (mental status). You feel like you may vomit, and the feeling does not go away. You cannot stop vomiting. These symptoms may be an emergency. Get medical help right away. Call your local emergency services (911 in the [place] Do not wait to see if the symptoms will go away. Do not drive yourself to the hospital. Summary Hyperglycemia is when the sugar (glucose) level in your blood is too high. High blood sugar can happen to people who have or do not have diabetes. Make sure you drink enough fluids and follow your meal plan. Exercise as often as told by your doctor. Contact your doctor if you have problems keeping your blood sugar in your target range. This information is not intended to replace advice given to you by your health care provider. Make sure you discuss any questions you have with your healthcare provider. Document Revised: 01/12/2020 Document Reviewed: 01/12/2020 Elsevier Patient Education  2022 Else[employer] Inc.   Diabetes Mellitus Basics  Diabetes mellitus, or diabetes, is a Underdown-term (chronic) disease. It occurs when the body does not properly use sugar (glucose) that is released from food after you eat. Diabetes mellitus may be caused by one or both of these problems: Your  pancreas does not make enough of a hormone called insulin. Your body does not react in a normal way to the insulin that it makes. Insulin lets glucose enter cells in your body. This gives you energy. If you have diabetes, glucose cannot get into cells. This causes high blood glucose (hyperglycemia). How to treat and manage diabetes You may need to take insulin or other diabetes medicines daily to keep your glucose in balance. If you are prescribed insulin, you will learn how to give yourself insulin by injection. You may need to adjust the amount of insulin youtake based on the foods that you eat. You will need to check your blood glucose levels using a glucose monitor as told by your health care provider. The readings can help determine if you havelow or high blood glucose. Generally, you should have these blood glucose levels: Before meals (preprandial): 80-130 mg/dL (1.4-4.3 mmol/L). After meals (postprandial): below 180 mg/dL (10 mmol/L). Hemoglobin A1c (HbA1c) level: less than 7%. Your health care provider will set treatment goals for you. Keep all follow-up visits. This is important.

## 2020-10-19 LAB — HEMOGLOBIN A1C
Hgb A1c MFr Bld: 10.2 % of total Hgb — ABNORMAL HIGH (ref <5.7)
Mean Plasma Glucose: 246 mg/dL
eAG (mmol/L): 13.6 mmol/L

## 2020-10-21 DIAGNOSIS — E1165 Type 2 diabetes mellitus with hyperglycemia: Secondary | ICD-10-CM | POA: Insufficient documentation

## 2020-10-21 DIAGNOSIS — E119 Type 2 diabetes mellitus without complications: Secondary | ICD-10-CM | POA: Insufficient documentation

## 2020-10-21 MED ORDER — METFORMIN HCL 500 MG PO TABS: 1000.0000 mg | ORAL_TABLET | Freq: Two times a day (BID) | ORAL | 1 refills | Status: DC

## 2020-10-21 MED ORDER — BLOOD GLUCOSE MONITOR KIT: PACK | 0 refills | Status: DC

## 2020-10-21 NOTE — Addendum Note (Signed)
Addended by: Danelle Berry on: 10/21/2020 04:57 PM   Modules accepted: Orders

## 2020-11-04 ENCOUNTER — Other Ambulatory Visit: Payer: Self-pay

## 2020-11-04 ENCOUNTER — Ambulatory Visit (INDEPENDENT_AMBULATORY_CARE_PROVIDER_SITE_OTHER): Payer: BC Managed Care – PPO | Admitting: Dermatology

## 2020-11-04 VITALS — Wt 218.0 lb

## 2020-11-04 DIAGNOSIS — Z79899 Other long term (current) drug therapy: Secondary | ICD-10-CM | POA: Diagnosis not present

## 2020-11-04 DIAGNOSIS — L853 Xerosis cutis: Secondary | ICD-10-CM

## 2020-11-04 DIAGNOSIS — K130 Diseases of lips: Secondary | ICD-10-CM | POA: Diagnosis not present

## 2020-11-04 DIAGNOSIS — L700 Acne vulgaris: Secondary | ICD-10-CM

## 2020-11-04 MED ORDER — ISOTRETINOIN 30 MG PO CAPS: 30.0000 mg | ORAL_CAPSULE | Freq: Every day | ORAL | 0 refills | Status: DC

## 2020-11-04 NOTE — Patient Instructions (Signed)

## 2020-11-04 NOTE — Progress Notes (Signed)
   Isotretinoin Follow-Up Visit   Subjective  Nicole Chambers is a 30 y.o. female who presents for the following: Acne (Face, 30m f/u, Absorica 20mg  1 po qd).  Week # 4   Isotretinoin F/U - 11/04/20 0900       Isotretinoin Follow Up   iPledge # 11/06/20    Date 11/04/20    Weight 218 lb (98.9 kg)    Two Forms of Birth Control Oral Contraceptives (w/ estrogen);Female Condom    Acne breakouts since last visit? yes      Dosage   Target Dosage (mg) 14835    Current (To Date) Dosage (mg) 600    To Go Dosage (mg) 14235      Side Effects   Skin Chapped Lips;Dry Lips;Nosebleed;Dry Nose;Dry Eyes    Gastrointestinal WNL    Neurological Headache    Constitutional Muscle/joint aches             Side effects: Dry skin, dry lips  Denies changes in night vision, shortness of breath, abdominal pain, nausea, vomiting, diarrhea, blood in stool or urine, visual changes, headaches, epistaxis, joint pain, myalgias, mood changes, depression, or suicidal ideation.   Patient is not pregnant, not seeking pregnancy, and not breastfeeding.   The following portions of the chart were reviewed this encounter and updated as appropriate: medications, allergies, medical history  Review of Systems:  No other skin or systemic complaints except as noted in HPI or Assessment and Plan.  Objective  Well appearing patient in no apparent distress; mood and affect are within normal limits.  An examination of the face, neck, chest, and back was performed and relevant findings are noted below.   face Inflammatory paps bil cheeks, hyperpigmented macules chin and cheeks   Assessment & Plan   Acne vulgaris face  Severe; On Isotretinoin -  requiring FDA mandated monthly evaluations and laboratory monitoring; Chronic and Persistent; Not to Goal   IPledge: 11/06/20 2 forms of BC: Yaz/OCP, Female latex condoms [name] WK #4  Total mg = 600 Total mg/kg = 6.07mg /kg  Urine pregnancy test  performed in office today and was negative.  Patient demonstrates comprehension and confirms she will not get pregnant. Lot [id number] exp 2022-03-12  Pt confirmed in [facility] program.  Increase to Absorica 30mg  1 po qd #30 0rf  ISOtretinoin (ABSORICA) 30 MG capsule - face Take 1 capsule (30 mg total) by mouth daily.   Xerosis secondary to isotretinoin therapy - Continue emollients as directed  Cheilitis secondary to isotretinoin therapy - Continue lip balm as directed, Dr. Cortibalm recommended  [place] term medication management (isotretinoin) - While taking Isotretinoin and for 30 days after you finish the medication, do not get pregnant, do not share pills, do not donate blood. Isotretinoin is best absorbed when taken with a fatty meal. Isotretinoin can make you sensitive to the sun. Daily careful sun protection including sunscreen SPF 30+ when outdoors is recommended.  Follow-up in 30 days.  I, 06-09-2001, RMA, am acting as scribe for Clayborne Artist, MD .  Documentation: I have reviewed the above documentation for accuracy and completeness, and I agree with the above.  Ardis Rowan MD

## 2020-11-15 ENCOUNTER — Ambulatory Visit (INDEPENDENT_AMBULATORY_CARE_PROVIDER_SITE_OTHER): Payer: BC Managed Care – PPO | Admitting: Family Medicine

## 2020-11-15 ENCOUNTER — Other Ambulatory Visit: Payer: Self-pay

## 2020-11-15 ENCOUNTER — Encounter: Payer: Self-pay | Admitting: Family Medicine

## 2020-11-15 VITALS — BP 132/84 | HR 95 | Temp 98.4°F | Resp 16 | Ht 72.0 in | Wt 222.3 lb

## 2020-11-15 DIAGNOSIS — E669 Obesity, unspecified: Secondary | ICD-10-CM

## 2020-11-15 DIAGNOSIS — B373 Candidiasis of vulva and vagina: Secondary | ICD-10-CM

## 2020-11-15 DIAGNOSIS — E1165 Type 2 diabetes mellitus with hyperglycemia: Secondary | ICD-10-CM | POA: Diagnosis not present

## 2020-11-15 DIAGNOSIS — Z23 Encounter for immunization: Secondary | ICD-10-CM | POA: Diagnosis not present

## 2020-11-15 DIAGNOSIS — E119 Type 2 diabetes mellitus without complications: Secondary | ICD-10-CM

## 2020-11-15 DIAGNOSIS — E782 Mixed hyperlipidemia: Secondary | ICD-10-CM

## 2020-11-15 DIAGNOSIS — Z6830 Body mass index (BMI) 30.0-30.9, adult: Secondary | ICD-10-CM

## 2020-11-15 DIAGNOSIS — B3731 Acute candidiasis of vulva and vagina: Secondary | ICD-10-CM

## 2020-11-15 MED ORDER — LANCET DEVICES MISC
3 refills | Status: DC
Start: 2020-11-15 — End: 2021-05-29

## 2020-11-15 MED ORDER — FLUCONAZOLE 150 MG PO TABS
150.0000 mg | ORAL_TABLET | ORAL | 0 refills | Status: DC | PRN
Start: 2020-11-15 — End: 2020-11-15

## 2020-11-15 MED ORDER — ATORVASTATIN CALCIUM 10 MG PO TABS: 10.0000 mg | ORAL_TABLET | Freq: Every day | ORAL | 3 refills | Status: DC

## 2020-11-15 MED ORDER — BLOOD GLUCOSE TEST VI STRP: ORAL_STRIP | 3 refills | Status: DC

## 2020-11-15 MED ORDER — FLUCONAZOLE 150 MG PO TABS: 150.0000 mg | ORAL_TABLET | ORAL | 1 refills | Status: DC | PRN

## 2020-11-15 NOTE — Progress Notes (Signed)
Name: Nicole Chambers   MRN: [id number]    DOB: Aug 09, 1990   Date:11/15/2020       Progress Note  Chief Complaint  Patient presents with   Diabetes    Subjective:   Nicole Chambers is a 30 y.o. female, presents to clinic for new onset DM Started metformin instructed to gradually increase dose as tolerated up to 1000 mg BID Taking 1000 mg BID, tolerating pretty well, some diarrhea, trying to make better food choices Only got a sample meter - sugars still high lowest was 170's, often still above 200 Some blurry vision and tingling in LE/feet, no urinary frequency urgency, weight loss Suspected yeast infection, with vulvovaginal itching burning and white thicker discharge Lab Results  Component Value Date   HGBA1C 10.2 (H) 10/18/2020     Chemistry      Component Value Date/Time   NA 137 10/02/2020 0939   K 4.2 10/02/2020 0939   CL 99 10/02/2020 0939   CO2 17 (L) 10/02/2020 0939   BUN 10 10/02/2020 0939   CREATININE 0.66 10/02/2020 0939      Component Value Date/Time   CALCIUM 9.2 10/02/2020 0939   ALKPHOS 66 10/02/2020 0939   AST 11 10/02/2020 0939   ALT 13 10/02/2020 0939   BILITOT 0.2 10/02/2020 0939     Lab Results  Component Value Date   CHOL 250 (H) 10/02/2020   HDL 60 10/02/2020   LDLCALC 144 (H) 10/02/2020   TRIG 258 (H) 10/02/2020   CHOLHDL 4.2 10/02/2020   Needs foot exam, eye exam, to start statin and ACEI/ARB and microalbumin BP - normally at goal for age, a little elevated today due to anxiety/irritation, no past dx of HTN or meds BP Readings from Last 3 Encounters:  11/15/20 132/84  10/18/20 112/68  04/25/20 120/70        Current Outpatient Medications:    albuterol (VENTOLIN HFA) 108 (90 Base) MCG/ACT inhaler, Inhale 2 puffs into the lungs every 6 (six) hours as needed for wheezing or shortness of breath (chest tightness, coughing fit)., Disp: 18 g, Rfl: 0   blood glucose meter kit and supplies KIT, Dispense based on patient and insurance preference.  Use up to four times daily as directed. (FOR ICD-9 250.00, 250.01)., Disp: 1 each, Rfl: 0   ISOtretinoin (ABSORICA) 20 MG capsule, Take 1 capsule (20 mg total) by mouth daily., Disp: 30 capsule, Rfl: 0   ISOtretinoin (ABSORICA) 30 MG capsule, Take 1 capsule (30 mg total) by mouth daily., Disp: 30 capsule, Rfl: 0   metFORMIN (GLUCOPHAGE) 500 MG tablet, Take 2 tablets (1,000 mg total) by mouth 2 (two) times daily with a meal., Disp: 270 tablet, Rfl: 1   norgestimate-ethinyl estradiol (ORTHO-CYCLEN) 0.25-35 MG-MCG tablet, Take 1 tablet by mouth daily., Disp: 90 tablet, Rfl: 3  Patient Active Problem List   Diagnosis Date Noted   New onset type 2 diabetes mellitus ([place]) 10/21/2020   Uncontrolled type 2 diabetes mellitus with hyperglycemia ([place]) 10/21/2020   Allergic rhinitis 05/23/2019   Reactive airway disease with acute exacerbation 05/23/2019   Class 1 obesity without serious comorbidity with body mass index (BMI) of 30.0 to 30.9 in adult 05/23/2019    History reviewed. No pertinent surgical history.  Family History  Problem Relation Age of Onset   Diabetes Mother     Social History   Tobacco Use   Smoking status: Never   Smokeless tobacco: Never   Tobacco comments:    tried cigarettes as a teenager  Vaping Use   Vaping Use: Never used  Substance Use Topics   Alcohol use: Yes    Comment: wine occass 1-2 x a month, sometimes a beer   Drug use: Never     No Known Allergies  Health Maintenance  Topic Date Due   PNEUMOCOCCAL POLYSACCHARIDE VACCINE AGE 74-64 HIGH RISK  Never done   Pneumococcal Vaccine 67-66 Years old (1 - PCV) Never done   FOOT EXAM  Never done   OPHTHALMOLOGY EXAM  Never done   URINE MICROALBUMIN  Never done   COVID-19 Vaccine (1) 12/01/2020 (Originally 08/13/1997)   INFLUENZA VACCINE  12/25/2020 (Originally 11/11/2020)   Hepatitis C Screening  04/25/2021 (Originally 08/14/2010)   TETANUS/TDAP  10/18/2021 (Originally 06/21/2015)   HEMOGLOBIN A1C  04/20/2021    PAP-Cervical Cytology Screening  04/26/2023   PAP SMEAR-Modifier  04/26/2023   HIV Screening  Completed   HPV VACCINES  Aged Out    Chart Review Today: I personally reviewed active problem list, medication list, allergies, family history, social history, health maintenance, notes from last encounter, lab results, imaging with the patient/caregiver today.  Review of Systems  Constitutional: Negative.   HENT: Negative.    Eyes: Negative.   Respiratory: Negative.    Cardiovascular: Negative.   Gastrointestinal: Negative.   Endocrine: Negative.   Genitourinary: Negative.   Musculoskeletal: Negative.   Skin: Negative.   Allergic/Immunologic: Negative.   Neurological: Negative.   Hematological: Negative.   Psychiatric/Behavioral: Negative.    All other systems reviewed and are negative.   Objective:   Vitals:   11/15/20 1357  BP: 132/84  Pulse: 95  Resp: 16  Temp: 98.4 F (36.9 C)  SpO2: 98%  Weight: 222 lb 4.8 oz (100.8 kg)  Height: 6' (1.829 m)    Body mass index is 30.15 kg/m.  Physical Exam Vitals and nursing note reviewed.  Constitutional:      General: She is not in acute distress.    Appearance: Normal appearance. She is well-developed, well-groomed and overweight. She is not ill-appearing, toxic-appearing or diaphoretic.     Interventions: Face mask in place.  HENT:     Head: Normocephalic and atraumatic.     Right Ear: External ear normal.     Left Ear: External ear normal.  Eyes:     General: Lids are normal. No scleral icterus.       Right eye: No discharge.        Left eye: No discharge.     Conjunctiva/sclera: Conjunctivae normal.  Neck:     Trachea: Phonation normal. No tracheal deviation.  Cardiovascular:     Rate and Rhythm: Normal rate and regular rhythm.     Pulses: Normal pulses.          Radial pulses are 2+ on the right side and 2+ on the left side.       Posterior tibial pulses are 2+ on the right side and 2+ on the left side.     Heart  sounds: Normal heart sounds. No murmur heard.   No friction rub. No gallop.  Pulmonary:     Effort: Pulmonary effort is normal. No respiratory distress.     Breath sounds: Normal breath sounds. No stridor. No wheezing, rhonchi or rales.  Chest:     Chest wall: No tenderness.  Abdominal:     General: Bowel sounds are normal. There is no distension.     Palpations: Abdomen is soft.  Musculoskeletal:     Right lower leg:  No edema.     Left lower leg: No edema.  Skin:    General: Skin is warm and dry.     Coloration: Skin is not jaundiced or pale.     Findings: No rash.  Neurological:     Mental Status: She is alert.     Motor: No abnormal muscle tone.     Gait: Gait normal.  Psychiatric:        Attention and Perception: Attention normal.        Mood and Affect: Affect normal. Mood is anxious.        Speech: Speech normal.        Behavior: Behavior normal. Behavior is cooperative.      Diabetic Foot Exam - Simple   Simple Foot Form Diabetic Foot exam was performed with the following findings: Yes 11/15/2020  2:00 PM  Visual Inspection No deformities, no ulcerations, no other skin breakdown bilaterally: Yes Sensation Testing Intact to touch and monofilament testing bilaterally: Yes Pulse Check Posterior Tibialis and Dorsalis pulse intact bilaterally: Yes Comments      Assessment & Plan:     ICD-10-CM   1. New onset type 2 diabetes mellitus (HCC)  E11.9 Microalbumin, urine    Ambulatory referral to Ophthalmology    atorvastatin (LIPITOR) 10 MG tablet    Referral to Nutrition and Diabetes Services    For home use only DME Other see comment    Glucose Blood (BLOOD GLUCOSE TEST STRIPS) STRP    Lancet Devices MISC   more education, discussed standard of care, foot exam done, eye exam ordered, agreed to statin, will wait on ACEI/ARB med     2. Uncontrolled type 2 diabetes mellitus with hyperglycemia (HCC)  E11.65 Microalbumin, urine    Ambulatory referral to Ophthalmology     atorvastatin (LIPITOR) 10 MG tablet    Referral to Nutrition and Diabetes Services    For home use only DME Other see comment    Glucose Blood (BLOOD GLUCOSE TEST STRIPS) STRP    Lancet Devices MISC   dose not sound yet well controlled, lowest fasting blood sugar 170's but she was not able to get glucometer from pharmacy - continue meds/diet/lifestyle efforts    3. Class 1 obesity without serious comorbidity with body mass index (BMI) of 30.0 to 30.9 in adult, unspecified obesity type  E66.9 Referral to Nutrition and Diabetes Services   Z68.30     4. Need for pneumococcal vaccination  Z23     5. Mixed hyperlipidemia  E78.2 atorvastatin (LIPITOR) 10 MG tablet    Referral to Nutrition and Diabetes Services   agrees to statin with DM and HLD - will need to watch LFTs with accutane med    6. Yeast vaginitis  B37.3 fluconazole (DIFLUCAN) 150 MG tablet    DISCONTINUED: fluconazole (DIFLUCAN) 150 MG tablet   treat with diflucan      Spoke with pt about new diagnosis.  Discussed A1C results with them and explained what an A1C is, basic pathophysiology of DM Type 2, basic home care, basic diabetes diet nutrition principles, importance of checking CBGs and maintaining good CBG control to prevent [place]-term and short-term complications.  Reviewed signs and symptoms of hyperglycemia and hypoglycemia and how to treat hypoglycemia at home.  Also reviewed blood sugar goals and A1c goals for home.    CMAs to provide ongoing basic DM education at bedside with this patient.  Have given pt handouts, glucose meter and online references.  Have also placed  Dm/nutrition/dietician consult for DM diet education for this patient.  Pt agreed to pneumovax, but unfortunately did not get administered before pt was told to check out and leave today  Discussed possibly adding on 2nd med if A1C not to goal with recheck in Oct GLP-1 inhibitor would be effective if insurance would approve  Health Maintenance  Topic  Date Due   Pneumococcal vaccine  Never done   Pneumococcal Vaccination (1 - PCV) Never done   Eye exam for diabetics  Never done   Urine Protein Check  Never done   COVID-19 Vaccine (1) 12/01/2020*   Flu Shot  12/25/2020*   Hepatitis C Screening: USPSTF Recommendation to screen - Ages 18-79 yo.  04/25/2021*   Tetanus Vaccine  10/18/2021*   Hemoglobin A1C  04/20/2021   Complete foot exam   11/15/2021   Pap Smear  04/26/2023   Pap Smear  04/26/2023   HIV Screening  Completed   HPV Vaccine  Aged Out  *Topic was postponed. The date shown is not the original due date.     No follow-ups on file.   Delsa Grana, PA-C 11/15/20 2:17 PM

## 2020-11-15 NOTE — Patient Instructions (Signed)
Diabetes Mellitus Basics  Diabetes mellitus, or diabetes, is a [place]-term (chronic) disease. It occurs when the body does not properly use sugar (glucose) that is released from food after you eat. Diabetes mellitus may be caused by one or both of these problems: Your pancreas does not make enough of a hormone called insulin. Your body does not react in a normal way to the insulin that it makes. Insulin lets glucose enter cells in your body. This gives you energy. If you have diabetes, glucose cannot get into cells. This causes high blood glucose (hyperglycemia). How to treat and manage diabetes You may need to take insulin or other diabetes medicines daily to keep your glucose in balance. If you are prescribed insulin, you will learn how to give yourself insulin by injection. You may need to adjust the amount of insulin youtake based on the foods that you eat. You will need to check your blood glucose levels using a glucose monitor as told by your health care provider. The readings can help determine if you havelow or high blood glucose. Generally, you should have these blood glucose levels: Before meals (preprandial): 80-130 mg/dL (4.4-7.2 mmol/L). After meals (postprandial): below 180 mg/dL (10 mmol/L). Hemoglobin A1c (HbA1c) level: less than 7%. Your health care provider will set treatment goals for you. Keep all follow-up visits. This is important. Follow these instructions at home: Diabetes medicines Take your diabetes medicines every day as told by your health care provider. List your diabetes medicines here: Name of medicine: ______________________________ Amount (dose): _______________ Time (a.m./p.m.): _______________ Notes: ___________________________________ Name of medicine: ______________________________ Amount (dose): _______________ Time (a.m./p.m.): _______________ Notes: ___________________________________ Name of medicine: ______________________________ Amount (dose):  _______________ Time (a.m./p.m.): _______________ Notes: ___________________________________ Insulin If you use insulin, list the types of insulin you use here: Insulin type: ______________________________ Amount (dose): _______________ Time (a.m./p.m.): _______________Notes: ___________________________________ Insulin type: ______________________________ Amount (dose): _______________ Time (a.m./p.m.): _______________ Notes: ___________________________________ Insulin type: ______________________________ Amount (dose): _______________ Time (a.m./p.m.): _______________ Notes: ___________________________________ Insulin type: ______________________________ Amount (dose): _______________ Time (a.m./p.m.): _______________ Notes: ___________________________________ Insulin type: ______________________________ Amount (dose): _______________ Time (a.m./p.m.): _______________ Notes: ___________________________________ Managing blood glucose  Check your blood glucose levels using a glucose monitor as told by your healthcare provider. Write down the times that you check your glucose levels here: Time: _______________ Notes: ___________________________________ Time: _______________ Notes: ___________________________________ Time: _______________ Notes: ___________________________________ Time: _______________ Notes: ___________________________________ Time: _______________ Notes: ___________________________________ Time: _______________ Notes: ___________________________________  Low blood glucose Low blood glucose (hypoglycemia) is when glucose is at or below 70 mg/dL (3.9 mmol/L). Symptoms may include: Feeling: Hungry. Sweaty and clammy. Irritable or easily upset. Dizzy. Sleepy. Having: A fast heartbeat. A headache. A change in your vision. Numbness around the mouth, lips, or tongue. Having trouble with: Moving (coordination). Sleeping. Treating low blood glucose To treat low blood  glucose, eat or drink something containing sugar right away. If you can think clearly and swallow safely, follow the 15:15 rule: Take 15 grams of a fast-acting carb (carbohydrate), as told by your health care provider. Some fast-acting carbs are: Glucose tablets: take 3-4 tablets. Hard candy: eat 3-5 pieces. Fruit juice: drink 4 oz (120 mL). Regular (not diet) soda: drink 4-6 oz (120-180 mL). Honey or sugar: eat 1 Tbsp (15 mL). Check your blood glucose levels 15 minutes after you take the carb. If your glucose is still at or below 70 mg/dL (3.9 mmol/L), take 15 grams of a carb again. If your glucose does not go above 70 mg/dL (3.9 mmol/L) after 3 tries, get   help right away. After your glucose goes back to normal, eat a meal or a snack within 1 hour. Treating very low blood glucose If your glucose is at or below 54 mg/dL (3 mmol/L), you have very low blood glucose (severe hypoglycemia). This is an emergency. Do not wait to see if the symptoms will go away. Get medical help right away. Call your local emergency services (911 in the [place] Do not drive yourself to the hospital. Questions to ask your health care provider Should I talk with a diabetes educator? What equipment will I need to care for myself at home? What diabetes medicines do I need? When should I take them? How often do I need to check my blood glucose levels? What number can I call if I have questions? When is my follow-up visit? Where can I find a support group for people with diabetes? Where to find more information American Diabetes Association: www.diabetes.org Association of Diabetes Care and Education Specialists: www.diabeteseducator.org Contact a health care provider if: Your blood glucose is at or above 240 mg/dL (13.3 mmol/L) for 2 days in a row. You have been sick or have had a fever for 2 days or more, and you are not getting better. You have any of these problems for more than 6 hours: You cannot eat or  drink. You feel nauseous. You vomit. You have diarrhea. Get help right away if: Your blood glucose is lower than 54 mg/dL (3 mmol/L). You get confused. You have trouble thinking clearly. You have trouble breathing. These symptoms may represent a serious problem that is an emergency. Do not wait to see if the symptoms will go away. Get medical help right away. Call your local emergency services (911 in the [place] Do not drive yourself to the hospital. Summary Diabetes mellitus is a chronic disease that occurs when the body does not properly use sugar (glucose) that is released from food after you eat. Take insulin and diabetes medicines as told. Check your blood glucose every day, as often as told. Keep all follow-up visits. This is important. This information is not intended to replace advice given to you by your health care provider. Make sure you discuss any questions you have with your healthcare provider. Document Revised: 08/01/2019 Document Reviewed: 08/01/2019 Elsevier Patient Education  2022 Elsevier Inc.  

## 2020-11-16 LAB — MICROALBUMIN, URINE: Microalb, Ur: 0.3 mg/dL

## 2020-11-21 DIAGNOSIS — E119 Type 2 diabetes mellitus without complications: Secondary | ICD-10-CM | POA: Diagnosis not present

## 2020-11-21 LAB — HM DIABETES EYE EXAM

## 2020-12-04 ENCOUNTER — Ambulatory Visit: Payer: BC Managed Care – PPO | Admitting: *Deleted

## 2020-12-09 ENCOUNTER — Ambulatory Visit (INDEPENDENT_AMBULATORY_CARE_PROVIDER_SITE_OTHER): Payer: BC Managed Care – PPO | Admitting: Dermatology

## 2020-12-09 ENCOUNTER — Other Ambulatory Visit: Payer: Self-pay

## 2020-12-09 VITALS — Wt 218.0 lb

## 2020-12-09 DIAGNOSIS — L700 Acne vulgaris: Secondary | ICD-10-CM | POA: Diagnosis not present

## 2020-12-09 DIAGNOSIS — L853 Xerosis cutis: Secondary | ICD-10-CM | POA: Diagnosis not present

## 2020-12-09 DIAGNOSIS — Z79899 Other long term (current) drug therapy: Secondary | ICD-10-CM | POA: Diagnosis not present

## 2020-12-09 DIAGNOSIS — K130 Diseases of lips: Secondary | ICD-10-CM | POA: Diagnosis not present

## 2020-12-09 MED ORDER — ISOTRETINOIN 20 MG PO CAPS: 20.0000 mg | ORAL_CAPSULE | Freq: Two times a day (BID) | ORAL | 0 refills | Status: DC

## 2020-12-09 NOTE — Progress Notes (Signed)
   Isotretinoin Follow-Up Visit   Subjective  Nicole Chambers is a 30 y.o. female who presents for the following: Acne (Face, wk 8 Isotretinoin, Isotretinoin 30mg  1 po qd).  Week # 8   Isotretinoin F/U - 12/09/20 1000       Isotretinoin Follow Up   iPledge # 12/11/20    Date 12/09/20    Weight 218 lb (98.9 kg)    Two Forms of Birth Control Oral Contraceptives (w/ estrogen);Female Condom      Dosage   Target Dosage (mg) 14835    Current (To Date) Dosage (mg) 1500    To Go Dosage (mg) 13335      Side Effects   Skin Chapped Lips;Dry Eyes;Dry Skin    Gastrointestinal WNL    Neurological Depression;Mood Swings    Constitutional WNL             Side effects: Dry skin, dry lips  Denies changes in night vision, shortness of breath, abdominal pain, nausea, vomiting, diarrhea, blood in stool or urine, visual changes, headaches, epistaxis, joint pain, myalgias, mood changes, depression, or suicidal ideation.   Patient is not pregnant, not seeking pregnancy, and not breastfeeding.   The following portions of the chart were reviewed this encounter and updated as appropriate: medications, allergies, medical history  Review of Systems:  No other skin or systemic complaints except as noted in HPI or Assessment and Plan.  Objective  Well appearing patient in no apparent distress; mood and affect are within normal limits.  An examination of the face, neck, chest, and back was performed and relevant findings are noted below.   face Hyperpigmented macules cheeks, closed comedones cheeks, nose   Assessment & Plan   Acne vulgaris face  Severe; On Isotretinoin -  requiring FDA mandated monthly evaluations and laboratory monitoring; Chronic and Persistent; Not to Goal  Wk 8 Isotretinoin IPLEDGE# 12/11/20 2 forms of BC: Yaz, female latex condoms [name]  Total mg = 1,500 Total mg/kg = 14.88mg /kg  Urine pregnancy test performed in office today and was negative.   Patient demonstrates comprehension and confirms she will not get pregnant.  Lot [id number], exp 05/13/2022  Due to the depression this month, will decrease to Absorica 20mg  1 po qd #30, 0rf, (depression likely situational and associated with school issues, pt denies suicidal ideation)  Pt confirmed in IPLEDGE program    ISOtretinoin (ABSORICA) 20 MG capsule - face Take 1 capsule (20 mg total) by mouth 2 (two) times daily.   Xerosis secondary to isotretinoin therapy - Continue emollients as directed  Cheilitis secondary to isotretinoin therapy - Continue lip balm as directed, Dr. Cortibalm recommended  [place] term medication management (isotretinoin) - While taking Isotretinoin and for 30 days after you finish the medication, do not get pregnant, do not share pills, do not donate blood. Isotretinoin is best absorbed when taken with a fatty meal. Isotretinoin can make you sensitive to the sun. Daily careful sun protection including sunscreen SPF 30+ when outdoors is recommended.  Follow-up in 30 days.  I, 06-09-2001, RMA, am acting as scribe for Clayborne Artist, MD .  Documentation: I have reviewed the above documentation for accuracy and completeness, and I agree with the above.  Ardis Rowan MD

## 2020-12-09 NOTE — Patient Instructions (Signed)

## 2020-12-11 ENCOUNTER — Other Ambulatory Visit: Payer: Self-pay

## 2020-12-11 ENCOUNTER — Encounter: Payer: Self-pay | Admitting: *Deleted

## 2020-12-11 ENCOUNTER — Encounter: Payer: BC Managed Care – PPO | Attending: Family Medicine | Admitting: *Deleted

## 2020-12-11 VITALS — BP 120/72 | Ht 72.0 in | Wt 220.9 lb

## 2020-12-11 DIAGNOSIS — E119 Type 2 diabetes mellitus without complications: Secondary | ICD-10-CM | POA: Insufficient documentation

## 2020-12-11 DIAGNOSIS — E1165 Type 2 diabetes mellitus with hyperglycemia: Secondary | ICD-10-CM

## 2020-12-11 NOTE — Progress Notes (Signed)
Diabetes Self-Management Education  Visit Type: First/Initial  Appt. Start Time: 0825 Appt. End Time: 0915  12/11/2020  Ms. Nicole Chambers, identified by name and date of birth, is a 30 y.o. female with a diagnosis of Diabetes: Type 2.   ASSESSMENT  Blood pressure 120/72, height 6' (1.829 m), weight 220 lb 14.4 oz (100.2 kg),  Body mass index is 29.96 kg/m.   Diabetes Self-Management Education - 12/11/20 1019       Visit Information   Visit Type First/Initial      Initial Visit   Diabetes Type Type 2    Are you currently following a meal plan? No    Are you taking your medications as prescribed? Yes    Date Diagnosed 1 month ago      Health Coping   How would you rate your overall health? Fair      Psychosocial Assessment   Patient Belief/Attitude about Diabetes Motivated to manage diabetes    Self-care barriers None    Self-management support Doctor's office;Family    Patient Concerns Nutrition/Meal planning;Medication;Glycemic Control;Weight Control;Healthy Lifestyle    Special Needs None    Preferred Learning Style Auditory;Visual    Learning Readiness Ready    How often do you need to have someone help you when you read instructions, pamphlets, or other written materials from your doctor or pharmacy? 1 - Never    What is the last grade level you completed in school? 12th grade      Pre-Education Assessment   Patient understands the diabetes disease and treatment process. Needs Instruction    Patient understands incorporating nutritional management into lifestyle. Needs Instruction    Patient undertands incorporating physical activity into lifestyle. Needs Instruction    Patient understands using medications safely. Needs Instruction    Patient understands monitoring blood glucose, interpreting and using results Needs Review    Patient understands prevention, detection, and treatment of acute complications. Needs Instruction    Patient understands prevention,  detection, and treatment of chronic complications. Needs Instruction    Patient understands how to develop strategies to address psychosocial issues. Needs Instruction    Patient understands how to develop strategies to promote health/change behavior. Needs Instruction      Complications   Last HgB A1C per patient/outside source 10.2 %   10/18/2020   How often do you check your blood sugar? 1-2 times/day    Fasting Blood glucose range (mg/dL) 937-902;409-735;>329   Pt reports fasting blood sugars range from 179-287 mg/dL.   Have you had a dilated eye exam in the past 12 months? Yes    Have you had a dental exam in the past 12 months? Yes    Are you checking your feet? No      Dietary Intake   Breakfast cereal and milk; breakfast bar; ham and cheese sandwich; sausage biscuit from fast food 2 x week    Snack (morning) reports snacking "all day" - chips, rice krispie treat, fruit (pineapple, peaches)    Lunch east out for lunch daily ([employer] - hamburger and fries, [employer]), chicken and vegetables    Dinner chicken, beef, occasional pork; potaotes, corn, rice, pasta, green beans, broccoli, greens, brussel sprouts; ([employer] - grilled chicken salad with lettuce, tomatoes, cuccumbers, bacon, cheese, bread)    Beverage(s) water, juice, regular soda      Exercise   Exercise Type ADL's      Patient Education   Previous Diabetes Education No    Disease state  Definition of  diabetes, type 1 and 2, and the diagnosis of diabetes;Factors that contribute to the development of diabetes    Nutrition management  Role of diet in the treatment of diabetes and the relationship between the three main macronutrients and blood glucose level;Food label reading, portion sizes and measuring food.;Reviewed blood glucose goals for pre and post meals and how to evaluate the patients' food intake on their blood glucose level.;Information on hints to eating out and maintain blood glucose control.    Physical activity and  exercise  Role of exercise on diabetes management, blood pressure control and cardiac health.    Medications Reviewed patients medication for diabetes, action, purpose, timing of dose and side effects.    Monitoring Purpose and frequency of SMBG.;Taught/discussed recording of test results and interpretation of SMBG.;Identified appropriate SMBG and/or A1C goals.    Chronic complications Relationship between chronic complications and blood glucose control    Psychosocial adjustment Identified and addressed patients feelings and concerns about diabetes      Individualized Goals (developed by patient)   Reducing Risk Other (comment)   improve blood sugars, decrease medications, lose weight, lead a healthier lifestyle, become more fit     Outcomes   Expected Outcomes Demonstrated interest in learning. Expect positive outcomes    Future DMSE 4-6 wks         Individualized Plan for Diabetes Self-Management Training:   Learning Objective:  Patient will have a greater understanding of diabetes self-management. Patient education plan is to attend individual and/or group sessions per assessed needs and concerns.   Plan:   Patient Instructions  Check blood sugars 1 x day before breakfast or 2 hrs after supper every day Bring blood sugar records to the next appointment  Exercise:  Begin walking  for    15  minutes   3  days a week and gradually increase to 30 minutes 5 x week  Eat 3 meals day,   1-2  snacks a day Space meals 4-6 hours apart Allow 2-3 hours between meals and snacks Avoid sugar sweetened drinks (soda, juices) Limit fried foods  Complete 3 Day Food Record and bring to next appt  Return for appointment on:  Friday January 10, 2021 at 1:30 pm with Velna Hatchet (nurse)   Expected Outcomes:  Demonstrated interest in learning. Expect positive outcomes  Education material provided:  General Meal Planning Guidelines Simple Meal Plan 3 Day Food Record  If problems or questions,  patient to contact team via:   Sharion Settler, RN, CCM, CDCES [phone]  Future DSME appointment: 4-6 wks January 10, 2021 with this nurse

## 2020-12-11 NOTE — Patient Instructions (Addendum)
Check blood sugars 1 x day before breakfast or 2 hrs after supper every day Bring blood sugar records to the next appointment  Exercise:  Begin walking  for    15  minutes   3  days a week and gradually increase to 30 minutes 5 x week  Eat 3 meals day,   1-2  snacks a day Space meals 4-6 hours apart Allow 2-3 hours between meals and snacks Avoid sugar sweetened drinks (soda, juices) Limit fried foods  Complete 3 Day Food Record and bring to next appt  Return for appointment on:  Friday January 10, 2021 at 1:30 pm with Velna Hatchet (nurse)

## 2021-01-10 ENCOUNTER — Ambulatory Visit: Payer: BC Managed Care – PPO | Admitting: *Deleted

## 2021-01-10 ENCOUNTER — Telehealth: Payer: Self-pay | Admitting: *Deleted

## 2021-01-10 NOTE — Telephone Encounter (Signed)
Patient No Show for today's appointment. Phone call to her number but she doesn't have a voice mail that is set up. Unable to leave a message.

## 2021-01-13 ENCOUNTER — Ambulatory Visit (INDEPENDENT_AMBULATORY_CARE_PROVIDER_SITE_OTHER): Payer: BC Managed Care – PPO | Admitting: Dermatology

## 2021-01-13 ENCOUNTER — Other Ambulatory Visit: Payer: Self-pay

## 2021-01-13 VITALS — Wt 218.0 lb

## 2021-01-13 DIAGNOSIS — L810 Postinflammatory hyperpigmentation: Secondary | ICD-10-CM | POA: Diagnosis not present

## 2021-01-13 DIAGNOSIS — L853 Xerosis cutis: Secondary | ICD-10-CM | POA: Diagnosis not present

## 2021-01-13 DIAGNOSIS — L700 Acne vulgaris: Secondary | ICD-10-CM

## 2021-01-13 DIAGNOSIS — K130 Diseases of lips: Secondary | ICD-10-CM

## 2021-01-13 DIAGNOSIS — Z79899 Other long term (current) drug therapy: Secondary | ICD-10-CM

## 2021-01-13 MED ORDER — ISOTRETINOIN 30 MG PO CAPS: 30.0000 mg | ORAL_CAPSULE | Freq: Every day | ORAL | 0 refills | Status: AC

## 2021-01-13 NOTE — Progress Notes (Signed)
   Isotretinoin Follow-Up Visit   Subjective  Nicole Chambers is a 30 y.o. female who presents for the following: Acne (Wk #12, Absorica 20mg  QD.).  Week # 12   Isotretinoin F/U - 01/13/21 0800       Isotretinoin Follow Up   iPledge # 03/15/21    Date 01/13/21    Weight 218 lb (98.9 kg)    Two Forms of Birth Control Oral Contraceptives (w/ estrogen);Female Condom    Acne breakouts since last visit? Yes      Dosage   Target Dosage (mg) 14835    Current (To Date) Dosage (mg) 2100    To Go Dosage (mg) 12735      Side Effects   Skin Chapped Lips;Dry Skin    Gastrointestinal WNL    Neurological WNL    Constitutional WNL    Other Side Effects Depression has improved this month.             Side effects: Dry skin, dry lips  Denies changes in night vision, shortness of breath, abdominal pain, nausea, vomiting, diarrhea, blood in stool or urine, visual changes, headaches, epistaxis, joint pain, myalgias, mood changes, depression, or suicidal ideation.   Patient is not pregnant, not seeking pregnancy, and not breastfeeding.   The following portions of the chart were reviewed this encounter and updated as appropriate: medications, allergies, medical history  Review of Systems:  No other skin or systemic complaints except as noted in HPI or Assessment and Plan.  Objective  Well appearing patient in no apparent distress; mood and affect are within normal limits.  An examination of the face, neck, chest, and back was performed and relevant findings are noted below.   face Hyperpigmented macules on the cheeks; few closed comedones on forehead and cheeks. No active lesions   Assessment & Plan   Acne vulgaris face  With PIH. Improving.  Severe; On Isotretinoin -  requiring FDA mandated monthly evaluations and laboratory monitoring; Chronic and Persistent; Improving but Not to Goal  Wk #12 Isotretinoin IPLEDGE# 2101 2 forms of BC: Yaz, female latex condoms [facility]   Total mg = 2100 mg Total mg/kg = 20.96mg /kg  Urine pregnancy test performed in office today and was negative.  Patient demonstrates comprehension and confirms she will not get pregnant.   Depression symptoms have resolved Increase Absorica 30mg  take 1 po QD dsp #30 0Rf.  Patient confirmed in [facility] program and Rx sent in.     ISOtretinoin (ABSORICA) 30 MG capsule - face Take 1 capsule (30 mg total) by mouth daily.   Xerosis secondary to isotretinoin therapy - Continue emollients as directed  Cheilitis secondary to isotretinoin therapy - Continue lip balm as directed, Dr. Cortibalm recommended  [place] term medication management (isotretinoin) - While taking Isotretinoin and for 30 days after you finish the medication, do not get pregnant, do not share pills, do not donate blood. Isotretinoin is best absorbed when taken with a fatty meal. Isotretinoin can make you sensitive to the sun. Daily careful sun protection including sunscreen SPF 30+ when outdoors is recommended.  Follow-up in 30 days.  [facility], CMA, am acting as scribe for Clayborne Artist, MD . Documentation: I have reviewed the above documentation for accuracy and completeness, and I agree with the above.  Cherlyn Labella MD

## 2021-01-13 NOTE — Patient Instructions (Signed)
While taking Isotretinoin and for 30 days after you finish the medication, do not get pregnant, do not share pills, do not donate blood.  Generic isotretinoin is best absorbed when taken with a fatty meal. Isotretinoin can make you sensitive to the sun. Daily careful sun protection including sunscreen SPF 30+ when outdoors is recommended.  If you have any questions or concerns for your doctor, please call our main line at [phone] and press option 4 to reach your doctor's medical assistant. If no one answers, please leave a voicemail as directed and we will return your call as soon as possible. Messages left after 4 pm will be answered the following business day.   You may also send us a message via MyChart. We typically respond to MyChart messages within 1-2 business days.  For prescription refills, please ask your pharmacy to contact our office. Our fax number is [phone].  If you have an urgent issue when the clinic is closed that cannot wait until the next business day, you can page your doctor at the number below.    Please note that while we do our best to be available for urgent issues outside of office hours, we are not available 24/7.   If you have an urgent issue and are unable to reach us, you may choose to seek medical care at your doctor's office, retail clinic, urgent care center, or emergency room.  If you have a medical emergency, please immediately call 911 or go to the emergency department.  Pager Numbers  - Dr. Kowalski: [phone]  - Dr. Moye: [phone]  - Dr. Stewart: [phone]  In the event of inclement weather, please call our main line at [phone] for an update on the status of any delays or closures.  Dermatology Medication Tips: Please keep the boxes that topical medications come in in order to help keep track of the instructions about where and how to use these. Pharmacies typically print the medication instructions only on the boxes and not  directly on the medication tubes.   If your medication is too expensive, please contact our office at [phone] option 4 or send us a message through MyChart.   We are unable to tell what your co-pay for medications will be in advance as this is different depending on your insurance coverage. However, we may be able to find a substitute medication at lower cost or fill out paperwork to get insurance to cover a needed medication.   If a prior authorization is required to get your medication covered by your insurance company, please allow us 1-2 business days to complete this process.  Drug prices often vary depending on where the prescription is filled and some pharmacies may offer cheaper prices.  The website www.goodrx.com contains coupons for medications through different pharmacies. The prices here do not account for what the cost may be with help from insurance (it may be cheaper with your insurance), but the website can give you the price if you did not use any insurance.  - You can print the associated coupon and take it with your prescription to the pharmacy.  - You may also stop by our office during regular business hours and pick up a GoodRx coupon card.  - If you need your prescription sent electronically to a different pharmacy, notify our office through [facility] MyChart or by phone at [phone] option 4.  

## 2021-01-20 ENCOUNTER — Ambulatory Visit: Payer: BC Managed Care – PPO | Admitting: Family Medicine

## 2021-01-22 ENCOUNTER — Other Ambulatory Visit: Payer: Self-pay

## 2021-01-22 ENCOUNTER — Encounter: Payer: Self-pay | Admitting: Family Medicine

## 2021-01-22 ENCOUNTER — Ambulatory Visit (INDEPENDENT_AMBULATORY_CARE_PROVIDER_SITE_OTHER): Payer: BC Managed Care – PPO | Admitting: Family Medicine

## 2021-01-22 VITALS — BP 132/82 | HR 98 | Temp 98.0°F | Resp 18 | Ht 72.0 in | Wt 215.2 lb

## 2021-01-22 DIAGNOSIS — J310 Chronic rhinitis: Secondary | ICD-10-CM

## 2021-01-22 DIAGNOSIS — Z5181 Encounter for therapeutic drug level monitoring: Secondary | ICD-10-CM

## 2021-01-22 DIAGNOSIS — E782 Mixed hyperlipidemia: Secondary | ICD-10-CM | POA: Diagnosis not present

## 2021-01-22 DIAGNOSIS — E669 Obesity, unspecified: Secondary | ICD-10-CM | POA: Diagnosis not present

## 2021-01-22 DIAGNOSIS — E1165 Type 2 diabetes mellitus with hyperglycemia: Secondary | ICD-10-CM | POA: Diagnosis not present

## 2021-01-22 DIAGNOSIS — N920 Excessive and frequent menstruation with regular cycle: Secondary | ICD-10-CM | POA: Insufficient documentation

## 2021-01-22 DIAGNOSIS — Z6830 Body mass index (BMI) 30.0-30.9, adult: Secondary | ICD-10-CM

## 2021-01-22 DIAGNOSIS — Z23 Encounter for immunization: Secondary | ICD-10-CM

## 2021-01-22 DIAGNOSIS — Z1331 Encounter for screening for depression: Secondary | ICD-10-CM

## 2021-01-22 DIAGNOSIS — R5383 Other fatigue: Secondary | ICD-10-CM

## 2021-01-22 DIAGNOSIS — J3489 Other specified disorders of nose and nasal sinuses: Secondary | ICD-10-CM

## 2021-01-22 MED ORDER — MUPIROCIN 2 % EX OINT: 1.0000 "application " | TOPICAL_OINTMENT | Freq: Two times a day (BID) | CUTANEOUS | 0 refills | Status: AC

## 2021-01-22 MED ORDER — OZEMPIC (0.25 OR 0.5 MG/DOSE) 2 MG/1.5ML ~~LOC~~ SOPN
0.2500 mg | PEN_INJECTOR | SUBCUTANEOUS | 0 refills | Status: AC
Start: 2021-01-22 — End: 2021-02-13

## 2021-01-22 MED ORDER — FREESTYLE LIBRE 3 SENSOR MISC: 1.0000 | Freq: Once | 0 refills | Status: AC

## 2021-01-22 NOTE — Patient Instructions (Signed)
Do the ozempic shot once a week at the lowest dose - 0.25 mg  It should help with your diabetes numbers, curb your appetite, it slows stomach emptying, so you usually feel full longer and need to eat smaller portions and meals.  Some people have temporary nausea or bloating if they increase their dose, start the med or eat too big of meals.

## 2021-01-22 NOTE — Progress Notes (Signed)
Name: Oneal Biglow   MRN: [id number]    DOB: 1991/01/29   Date:01/22/2021       Progress Note  Chief Complaint  Patient presents with   Diabetes   Hyperlipidemia    Subjective:   Nicole Chambers is a 30 y.o. female, presents to clinic for f/up  DM:  New onset DM about 3 months ago, here for her first f/up appt Pt managing DM with metformin 1000 mg BID Reports good med compliance Pt has no SE from meds. Blood sugars high - 190-200's Denies: Polyuria, polydipsia, vision changes, neuropathy, hypoglycemia Recent pertinent labs: Lab Results  Component Value Date   HGBA1C 10.2 (H) 10/18/2020   Lab Results  Component Value Date   MICROALBUR 0.3 11/15/2020   LDLCALC 144 (H) 10/02/2020   CREATININE 0.66 10/02/2020  Standard of care and health maintenance: Urine Microalbumin:  done Foot exam:  done DM eye exam:  done ACEI/ARB:  not on Statin:  started lipitor 10 3 months ago  Hyperlipidemia: With new onset DM and HLD started lipitor 10 3 months ago Last Lipids: Lab Results  Component Value Date   CHOL 250 (H) 10/02/2020   HDL 60 10/02/2020   LDLCALC 144 (H) 10/02/2020   TRIG 258 (H) 10/02/2020   CHOLHDL 4.2 10/02/2020   - Denies: Chest pain, shortness of breath, myalgias, claudication  Overweight - no longer obese - lost some weight, working with nutritionist Wt Readings from Last 5 Encounters:  01/22/21 215 lb 3.2 oz (97.6 kg)  01/13/21 218 lb (98.9 kg)  12/11/20 220 lb 14.4 oz (100.2 kg)  12/09/20 218 lb (98.9 kg)  11/15/20 222 lb 4.8 oz (100.8 kg)   BMI Readings from Last 5 Encounters:  01/22/21 29.19 kg/m  01/13/21 29.57 kg/m  12/11/20 29.96 kg/m  12/09/20 29.57 kg/m  11/15/20 30.15 kg/m   Positive phq screen Depression screen [facility] 2/9 01/22/2021 01/22/2021 12/11/2020  Decreased Interest 2 2 0  Down, Depressed, Hopeless 0 0 0  PHQ - 2 Score 2 2 0  Altered sleeping 3 - -  Tired, decreased energy 3 - -  Change in appetite 3 - -  Feeling bad or failure  about yourself  0 - -  Trouble concentrating 3 - -  Moving slowly or fidgety/restless 0 - -  Suicidal thoughts 0 - -  PHQ-9 Score 14 - -  Difficult doing work/chores Somewhat difficult - -   Reviewed today, she has felt really exhausted since school started and she is really busy, mood is overall good.  She is nervous to be here today.     Current Outpatient Medications:    albuterol (VENTOLIN HFA) 108 (90 Base) MCG/ACT inhaler, Inhale 2 puffs into the lungs every 6 (six) hours as needed for wheezing or shortness of breath (chest tightness, coughing fit)., Disp: 18 g, Rfl: 0   atorvastatin (LIPITOR) 10 MG tablet, Take 1 tablet (10 mg total) by mouth at bedtime., Disp: 90 tablet, Rfl: 3   fluconazole (DIFLUCAN) 150 MG tablet, Take 1 tablet (150 mg total) by mouth every 3 (three) days as needed (for vaginal itching/yeast infection sx)., Disp: 2 tablet, Rfl: 1   Glucose Blood (BLOOD GLUCOSE TEST STRIPS) STRP, Use as directed to monitor FSBS up to once daily . Dx: E11.65., Disp: 100 strip, Rfl: 3   ISOtretinoin (ABSORICA) 30 MG capsule, Take 1 capsule (30 mg total) by mouth daily., Disp: 30 capsule, Rfl: 0   Lancet Devices MISC, Use as directed to monitor  FSBS up to once a day. Dx: E11.65., Disp: 100 each, Rfl: 3   metFORMIN (GLUCOPHAGE) 500 MG tablet, Take 2 tablets (1,000 mg total) by mouth 2 (two) times daily with a meal., Disp: 270 tablet, Rfl: 1   mupirocin ointment (BACTROBAN) 2 %, Apply 1 application topically 2 (two) times daily for 7 days. Apply to affected area in nose with qtip, Disp: 22 g, Rfl: 0   norgestimate-ethinyl estradiol (ORTHO-CYCLEN) 0.25-35 MG-MCG tablet, Take 1 tablet by mouth daily., Disp: 90 tablet, Rfl: 3  Patient Active Problem List   Diagnosis Date Noted   New onset type 2 diabetes mellitus (HCC) 10/21/2020   Uncontrolled type 2 diabetes mellitus with hyperglycemia (HCC) 10/21/2020   Allergic rhinitis 05/23/2019   Reactive airway disease with acute exacerbation  05/23/2019   Class 1 obesity without serious comorbidity with body mass index (BMI) of 30.0 to 30.9 in adult 05/23/2019    No past surgical history on file.  Family History  Problem Relation Age of Onset   Diabetes Mother     Social History   Tobacco Use   Smoking status: Never   Smokeless tobacco: Never   Tobacco comments:    tried cigarettes as a teenager  Vaping Use   Vaping Use: Never used  Substance Use Topics   Alcohol use: Yes    Comment: wine occass 1-2 x a month, sometimes a beer   Drug use: Never     No Known Allergies  Health Maintenance  Topic Date Due   Hepatitis C Screening  04/25/2021 (Originally 08/14/2010)   TETANUS/TDAP  10/18/2021 (Originally 06/21/2015)   HEMOGLOBIN A1C  04/20/2021   FOOT EXAM  11/15/2021   URINE MICROALBUMIN  11/15/2021   OPHTHALMOLOGY EXAM  11/21/2021   PAP-Cervical Cytology Screening  04/26/2023   PAP SMEAR-Modifier  04/26/2023   INFLUENZA VACCINE  Completed   HIV Screening  Completed   HPV VACCINES  Aged Out   COVID-19 Vaccine  Discontinued    Chart Review Today: I personally reviewed active problem list, medication list, allergies, family history, social history, health maintenance, notes from last encounter, lab results, imaging with the patient/caregiver today.   Review of Systems  Constitutional: Negative.   HENT: Negative.    Eyes: Negative.   Respiratory: Negative.    Cardiovascular: Negative.   Gastrointestinal: Negative.   Endocrine: Negative.   Genitourinary: Negative.   Musculoskeletal: Negative.   Skin: Negative.   Allergic/Immunologic: Negative.   Neurological: Negative.   Hematological: Negative.   Psychiatric/Behavioral: Negative.    All other systems reviewed and are negative.   Objective:   Vitals:   01/22/21 1439  BP: 132/82  Pulse: 98  Resp: 18  Temp: 98 F (36.7 C)  TempSrc: Oral  SpO2: 98%  Weight: 215 lb 3.2 oz (97.6 kg)  Height: 6' (1.829 m)    Body mass index is 29.19  kg/m.  Physical Exam Vitals and nursing note reviewed.  Constitutional:      General: She is not in acute distress.    Appearance: Normal appearance. She is well-developed. She is not ill-appearing, toxic-appearing or diaphoretic.     Interventions: Face mask in place.  HENT:     Head: Normocephalic and atraumatic.     Right Ear: Tympanic membrane, ear canal and external ear normal. There is no impacted cerumen.     Left Ear: Ear canal and external ear normal. There is no impacted cerumen.     Nose: Nasal tenderness, mucosal edema, congestion and  rhinorrhea present. Rhinorrhea is clear.     Comments: Very erythematous nasal mucosa    Mouth/Throat:     Mouth: Mucous membranes are moist.     Pharynx: Oropharynx is clear. No oropharyngeal exudate or posterior oropharyngeal erythema.  Eyes:     General: Lids are normal. No scleral icterus.       Right eye: No discharge.        Left eye: No discharge.     Conjunctiva/sclera: Conjunctivae normal.  Neck:     Trachea: Phonation normal. No tracheal deviation.  Cardiovascular:     Rate and Rhythm: Normal rate and regular rhythm.     Pulses: Normal pulses.          Radial pulses are 2+ on the right side and 2+ on the left side.       Posterior tibial pulses are 2+ on the right side and 2+ on the left side.     Heart sounds: Normal heart sounds. No murmur heard.   No friction rub. No gallop.  Pulmonary:     Effort: Pulmonary effort is normal. No respiratory distress.     Breath sounds: Normal breath sounds. No stridor. No wheezing, rhonchi or rales.  Chest:     Chest wall: No tenderness.  Abdominal:     General: Bowel sounds are normal. There is no distension.     Palpations: Abdomen is soft.  Musculoskeletal:     Right lower leg: No edema.     Left lower leg: No edema.  Skin:    General: Skin is warm and dry.     Coloration: Skin is not jaundiced or pale.     Findings: No rash.  Neurological:     Mental Status: She is alert.      Motor: No abnormal muscle tone.     Gait: Gait normal.  Psychiatric:        Mood and Affect: Mood normal.        Speech: Speech normal.        Behavior: Behavior normal.        Assessment & Plan:     1. Uncontrolled type 2 diabetes mellitus with hyperglycemia (HCC) Blood sugars sound uncontrolled despite working on diet/lifestyle and max dose of metformin Sample of ozempic given and administered today - 0.25 mg dose  No hx of pancreatitis, MEN II or medullary thyroid cancer Reviewed meds, administration, SE, benefits with pt - will do 4 week f/up and see how she is tolerating Freestyle 3 meter sample also given  - Semaglutide,0.25 or 0.5MG /DOS, (OZEMPIC, 0.25 OR 0.5 MG/DOSE,) 2 MG/1.5ML SOPN; Inject 0.25 mg into the skin once a week for 4 doses.  Dispense: 0.75 mL; Refill: 0 - Continuous Blood Gluc Sensor (FREESTYLE LIBRE 3 SENSOR) MISC; 1 Device by Does not apply route once for 1 dose. Apply once to arm per directions, check sugars TID, remove after 14 days  Dispense: 1 each; Refill: 0  - Hemoglobin A1c - COMPLETE METABOLIC PANEL WITH GFR - Lipid panel - CBC with Differential/Platelet  2. Class 1 obesity without serious comorbidity with body mass index (BMI) of 30.0 to 30.9 in adult, unspecified obesity type She has lost weight and now BMI is in overweight category - Hemoglobin A1c - COMPLETE METABOLIC PANEL WITH GFR - Lipid panel  3. Mixed hyperlipidemia Started statin - tolerating w/o concerning SE If fatigue persists may try and hold statin for 1-2 weeks, but she has a lot going on and it  may not be due to statin at all Recheck labs and liver function - COMPLETE METABOLIC PANEL WITH GFR - Lipid panel  4. Encounter for medication monitoring - Hemoglobin A1c - COMPLETE METABOLIC PANEL WITH GFR - Lipid panel - CBC with Differential/Platelet  5. Need for influenza vaccination - Flu Vaccine QUAD 6+ mos PF IM (Fluarix Quad PF)  6. Positive depression screening Will  monitor - mostly tired, poor sleep, energy and appetite different - busy with school and exhausted No SI, HI, AVH Possibly hold statin  Mood overall good  7. Menorrhagia with regular cycle Managed by OBGYN, on monophasic OCP, pt states she still has extremely heavy menses, screen for anemia, last cbc was in 2017 - CBC with Differential/Platelet  8. Fatigue, unspecified type R/O anemia, secondary to stress? Anxiety? Depression?   - CBC with Differential/Platelet  9. Sore in nose Encouraged cool mister, saline nasal spray, allergy meds like claritin/zyrtec once a day and after sore improved add intranasal steroid daily  - mupirocin ointment (BACTROBAN) 2 %; Apply 1 application topically 2 (two) times daily for 7 days. Apply to affected area in nose with qtip  Dispense: 22 g; Refill: 0  10. Rhinitis, unspecified type See above     Return in about 1 month (around 02/22/2021) for DM and med follow up (DM ozempic vs trulicity).   Danelle Berry, PA-C 01/22/21 3:31 PM

## 2021-01-23 ENCOUNTER — Other Ambulatory Visit: Payer: Self-pay

## 2021-01-23 DIAGNOSIS — E782 Mixed hyperlipidemia: Secondary | ICD-10-CM

## 2021-01-23 DIAGNOSIS — R7989 Other specified abnormal findings of blood chemistry: Secondary | ICD-10-CM

## 2021-01-23 DIAGNOSIS — E1165 Type 2 diabetes mellitus with hyperglycemia: Secondary | ICD-10-CM

## 2021-01-23 LAB — CBC WITH DIFFERENTIAL/PLATELET
Absolute Monocytes: 319 cells/uL (ref 200–950)
Basophils Absolute: 23 cells/uL (ref 0–200)
Basophils Relative: 0.3 %
Eosinophils Absolute: 243 cells/uL (ref 15–500)
Eosinophils Relative: 3.2 %
HCT: 38.4 % (ref 35.0–45.0)
Hemoglobin: 12.6 g/dL (ref 11.7–15.5)
Lymphs Abs: 3116 cells/uL (ref 850–3900)
MCH: 28.9 pg (ref 27.0–33.0)
MCHC: 32.8 g/dL (ref 32.0–36.0)
MCV: 88.1 fL (ref 80.0–100.0)
MPV: 11 fL (ref 7.5–12.5)
Monocytes Relative: 4.2 %
Neutro Abs: 3899 cells/uL (ref 1500–7800)
Neutrophils Relative %: 51.3 %
Platelets: 376 10*3/uL (ref 140–400)
RBC: 4.36 10*6/uL (ref 3.80–5.10)
RDW: 13.5 % (ref 11.0–15.0)
Total Lymphocyte: 41 %
WBC: 7.6 10*3/uL (ref 3.8–10.8)

## 2021-01-23 LAB — LIPID PANEL
Cholesterol: 189 mg/dL (ref <200)
HDL: 59 mg/dL (ref >=50)
LDL Cholesterol (Calc): 90 mg/dL (calc)
Non-HDL Cholesterol (Calc): 130 mg/dL (calc) — ABNORMAL HIGH (ref <130)
Total CHOL/HDL Ratio: 3.2 (calc) (ref <5.0)
Triglycerides: 279 mg/dL — ABNORMAL HIGH (ref <150)

## 2021-01-23 LAB — COMPLETE METABOLIC PANEL WITH GFR
AG Ratio: 1.4 (calc) (ref 1.0–2.5)
ALT: 23 U/L (ref 6–29)
AST: 40 U/L — ABNORMAL HIGH (ref 10–30)
Albumin: 4.1 g/dL (ref 3.6–5.1)
Alkaline phosphatase (APISO): 51 U/L (ref 31–125)
BUN: 9 mg/dL (ref 7–25)
CO2: 24 mmol/L (ref 20–32)
Calcium: 9.8 mg/dL (ref 8.6–10.2)
Chloride: 101 mmol/L (ref 98–110)
Creat: 0.59 mg/dL (ref 0.50–0.96)
Globulin: 3 g/dL (calc) (ref 1.9–3.7)
Glucose, Bld: 239 mg/dL — ABNORMAL HIGH (ref 65–99)
Potassium: 4.2 mmol/L (ref 3.5–5.3)
Sodium: 136 mmol/L (ref 135–146)
Total Bilirubin: 0.2 mg/dL (ref 0.2–1.2)
Total Protein: 7.1 g/dL (ref 6.1–8.1)
eGFR: 126 mL/min/{1.73_m2} (ref >=60)

## 2021-01-23 LAB — HEMOGLOBIN A1C
Hgb A1c MFr Bld: 9.5 % of total Hgb — ABNORMAL HIGH (ref <5.7)
Mean Plasma Glucose: 226 mg/dL
eAG (mmol/L): 12.5 mmol/L

## 2021-01-31 ENCOUNTER — Encounter: Payer: Self-pay | Admitting: *Deleted

## 2021-01-31 ENCOUNTER — Other Ambulatory Visit: Payer: Self-pay | Admitting: Dermatology

## 2021-01-31 DIAGNOSIS — L700 Acne vulgaris: Secondary | ICD-10-CM

## 2021-02-03 ENCOUNTER — Other Ambulatory Visit: Payer: Self-pay | Admitting: Dermatology

## 2021-02-03 DIAGNOSIS — L700 Acne vulgaris: Secondary | ICD-10-CM

## 2021-02-04 ENCOUNTER — Encounter: Payer: Self-pay | Admitting: Dermatology

## 2021-02-17 ENCOUNTER — Ambulatory Visit (INDEPENDENT_AMBULATORY_CARE_PROVIDER_SITE_OTHER): Payer: BC Managed Care – PPO | Admitting: Dermatology

## 2021-02-17 ENCOUNTER — Other Ambulatory Visit: Payer: Self-pay

## 2021-02-17 VITALS — Wt 218.0 lb

## 2021-02-17 DIAGNOSIS — L700 Acne vulgaris: Secondary | ICD-10-CM

## 2021-02-17 DIAGNOSIS — K130 Diseases of lips: Secondary | ICD-10-CM

## 2021-02-17 DIAGNOSIS — L853 Xerosis cutis: Secondary | ICD-10-CM

## 2021-02-17 DIAGNOSIS — Z79899 Other long term (current) drug therapy: Secondary | ICD-10-CM

## 2021-02-17 MED ORDER — ISOTRETINOIN 30 MG PO CAPS: 30.0000 mg | ORAL_CAPSULE | Freq: Every day | ORAL | 0 refills | Status: DC

## 2021-02-17 NOTE — Progress Notes (Deleted)
Isotretinoin Follow-Up Visit   Subjective  Nicole Chambers is a 30 y.o. female who presents for the following: Acne (Face, back, 45m f/u, Isotretinoin wk 12, pt did not get medication last month due to finances ).  Week # ***   Isotretinoin F/U - 02/17/21 0800       Isotretinoin Follow Up   iPledge # [id number]    Date 02/17/21    Weight 218 lb (98.9 kg)    Two Forms of Birth Control Female Condom;Oral Contraceptives (w/ estrogen)    Acne breakouts since last visit? Yes      Dosage   Target Dosage (mg) 14835    Current (To Date) Dosage (mg) 2100    To Go Dosage (mg) 12735      Side Effects   Skin Chapped Lips;Dry Lips    Gastrointestinal WNL    Neurological WNL    Constitutional WNL             Side effects: Dry skin, dry lips  Denies changes in night vision, shortness of breath, abdominal pain, nausea, vomiting, diarrhea, blood in stool or urine, visual changes, headaches, epistaxis, joint pain, myalgias, mood changes, depression, or suicidal ideation.   Patient is not pregnant, not seeking pregnancy, and not breastfeeding.   The following portions of the chart were reviewed this encounter and updated as appropriate: medications, allergies, medical history  Review of Systems:  No other skin or systemic complaints except as noted in HPI or Assessment and Plan.  Objective  Well appearing patient in no apparent distress; mood and affect are within normal limits.  An examination of the face, neck, chest, and back was performed and relevant findings are noted below.     Assessment & Plan   Acne vulgaris face  Severe; On Isotretinoin -  requiring FDA mandated monthly evaluations and laboratory monitoring; Chronic and Persistent; Not to Goal   Wk #12 Isotretinoin IPLEDGE# [id number] 2 forms of BC: Yaz, female latex condoms [name]      Xerosis secondary to isotretinoin therapy - Continue emollients as directed  Cheilitis secondary to isotretinoin  therapy - Continue lip balm as directed, Dr. Clayborne Artist Cortibalm recommended  [place] term medication management (isotretinoin) - While taking Isotretinoin and for 30 days after you finish the medication, do not get pregnant, do not share pills, do not donate blood. Isotretinoin is best absorbed when taken with a fatty meal. Isotretinoin can make you sensitive to the sun. Daily careful sun protection including sunscreen SPF 30+ when outdoors is recommended.  Follow-up in 30 days.   Isotretinoin Follow-Up Visit   Subjective  Nicole Chambers is a 30 y.o. female who presents for the following: Acne (Face, back, 98m f/u, Isotretinoin wk 12, pt did not get medication last month due to finances ).  Week # 12   Isotretinoin F/U - 02/17/21 0800       Isotretinoin Follow Up   iPledge # [id number]    Date 02/17/21    Weight 218 lb (98.9 kg)    Two Forms of Birth Control Female Condom;Oral Contraceptives (w/ estrogen)    Acne breakouts since last visit? Yes      Dosage   Target Dosage (mg) 14835    Current (To Date) Dosage (mg) 2100    To Go Dosage (mg) 12735      Side Effects   Skin Chapped Lips;Dry Lips    Gastrointestinal WNL    Neurological WNL    Constitutional WNL  Side effects: Dry skin, dry lips  Denies changes in night vision, shortness of breath, abdominal pain, nausea, vomiting, diarrhea, blood in stool or urine, visual changes, headaches, epistaxis, joint pain, myalgias, mood changes, depression, or suicidal ideation.   Patient is not pregnant, not seeking pregnancy, and not breastfeeding.   The following portions of the chart were reviewed this encounter and updated as appropriate: medications, allergies, medical history  Review of Systems:  No other skin or systemic complaints except as noted in HPI or Assessment and Plan.  Objective  Well appearing patient in no apparent distress; mood and affect are within normal limits.  An examination of the face, neck,  chest, and back was performed and relevant findings are noted below.     Assessment & Plan   Acne vulgaris face  Severe; On Isotretinoin -  requiring FDA mandated monthly evaluations and laboratory monitoring; Chronic and Persistent; Not to Goal   Wk #12 Isotretinoin IPLEDGE# [id number] 2 forms of BC: Yaz, female latex condoms [name]      Xerosis secondary to isotretinoin therapy - Continue emollients as directed  Cheilitis secondary to isotretinoin therapy - Continue lip balm as directed, Dr. Clayborne Artist Cortibalm recommended  [place] term medication management (isotretinoin) - While taking Isotretinoin and for 30 days after you finish the medication, do not get pregnant, do not share pills, do not donate blood. Isotretinoin is best absorbed when taken with a fatty meal. Isotretinoin can make you sensitive to the sun. Daily careful sun protection including sunscreen SPF 30+ when outdoors is recommended.  Follow-up in 30 days.

## 2021-02-17 NOTE — Progress Notes (Signed)
   Isotretinoin Follow-Up Visit   Subjective  Nicole Chambers is a 30 y.o. female who presents for the following: Acne (Face, back, 67m f/u, Isotretinoin wk 12, pt did not get medication last month due to finances ).  Week # 12   Isotretinoin F/U - 02/17/21 0800       Isotretinoin Follow Up   iPledge # [id number]    Date 02/17/21    Weight 218 lb (98.9 kg)    Two Forms of Birth Control Female Condom;Oral Contraceptives (w/ estrogen)    Acne breakouts since last visit? Yes      Dosage   Target Dosage (mg) 14835    Current (To Date) Dosage (mg) 2100    To Go Dosage (mg) 12735      Side Effects   Skin Chapped Lips;Dry Lips    Gastrointestinal WNL    Neurological WNL    Constitutional WNL             Side effects: Dry skin, dry lips  Denies changes in night vision, shortness of breath, abdominal pain, nausea, vomiting, diarrhea, blood in stool or urine, visual changes, headaches, epistaxis, joint pain, myalgias, mood changes, depression, or suicidal ideation.   Patient is not pregnant, not seeking pregnancy, and not breastfeeding.   The following portions of the chart were reviewed this encounter and updated as appropriate: medications, allergies, medical history  Review of Systems:  No other skin or systemic complaints except as noted in HPI or Assessment and Plan.  Objective  Well appearing patient in no apparent distress; mood and affect are within normal limits.  An examination of the face, neck, chest, and back was performed and relevant findings are noted below.   face Hyperpigmented macules cheeks, few scattered closed comedones   Assessment & Plan   Acne vulgaris face  Severe; On Isotretinoin -  requiring FDA mandated monthly evaluations and laboratory monitoring; Chronic and Persistent; Not to Goal   Wk #12 Isotretinoin (pt did not get prescription from last month due to finances) IPLEDGE# [id number] 2 forms of BC: Yaz, female latex condoms [facility]  Total mg = 2100 mg Total mg/kg = 20.96mg /kg  Urine pregnancy test performed in office today and was negative.  Patient demonstrates comprehension and confirms she will not get pregnant.  LOT # [id number] exp 05/13/2022  Absorica 30mg  1 po qd #30 0rf  Pt confirmed in IPLEDGE program  ISOtretinoin (ABSORICA) 30 MG capsule - face Take 1 capsule (30 mg total) by mouth daily.   Xerosis secondary to isotretinoin therapy - Continue emollients as directed  Cheilitis secondary to isotretinoin therapy - Continue lip balm as directed, Dr. Cortibalm recommended  [place] term medication management (isotretinoin) - While taking Isotretinoin and for 30 days after you finish the medication, do not get pregnant, do not share pills, do not donate blood. Isotretinoin is best absorbed when taken with a fatty meal. Isotretinoin can make you sensitive to the sun. Daily careful sun protection including sunscreen SPF 30+ when outdoors is recommended.  Follow-up in 30 days.  I, Clayborne Artist, RMA, am acting as scribe for Ardis Rowan, MD .  Documentation: I have reviewed the above documentation for accuracy and completeness, and I agree with the above.  Willeen Niece MD

## 2021-02-17 NOTE — Patient Instructions (Signed)

## 2021-02-24 ENCOUNTER — Other Ambulatory Visit: Payer: Self-pay

## 2021-02-24 ENCOUNTER — Encounter: Payer: Self-pay | Admitting: Nurse Practitioner

## 2021-02-24 ENCOUNTER — Ambulatory Visit (INDEPENDENT_AMBULATORY_CARE_PROVIDER_SITE_OTHER): Payer: BC Managed Care – PPO | Admitting: Nurse Practitioner

## 2021-02-24 VITALS — BP 118/72 | HR 95 | Temp 98.2°F | Resp 18 | Ht 72.0 in | Wt 208.4 lb

## 2021-02-24 DIAGNOSIS — E1165 Type 2 diabetes mellitus with hyperglycemia: Secondary | ICD-10-CM | POA: Diagnosis not present

## 2021-02-24 DIAGNOSIS — E663 Overweight: Secondary | ICD-10-CM | POA: Diagnosis not present

## 2021-02-24 MED ORDER — OZEMPIC (0.25 OR 0.5 MG/DOSE) 2 MG/1.5ML ~~LOC~~ SOPN: 0.2500 mg | PEN_INJECTOR | SUBCUTANEOUS | 5 refills | Status: DC

## 2021-02-24 NOTE — Progress Notes (Signed)
BP 118/72   Pulse 95   Temp 98.2 F (36.8 C)   Resp 18   Ht 6' (1.829 m)   Wt 208 lb 6.4 oz (94.5 kg)   SpO2 99%   BMI 28.26 kg/m    Subjective:    Patient ID: Nicole Chambers, female    DOB: 21-Aug-1990, 30 y.o.   MRN: [id number]  HPI: Nicole Chambers is a 30 y.o. female, here alone  Chief Complaint  Patient presents with   Diabetes   DM:  She says she started ozempic 0.25 mg every week,  a month ago.  She is here for follow-up to see how well she is tolerating it.  She says she is tolerating it  fine. She denies any abdominal pain, nausea, or diarrhea.  She says that she has lost about 6-7 lbs.  She denies any polyphagia, polydipsia or polyuria.  She says she has been monitoring her blood sugar and it has been 170-200, non-fasting.  Discussed getting fasting blood sugar.  Will continue Ozempic.  She will return in 2 months for diabetes follow-up and A1C.   Overweight:  She has lost about 6 lbs since October.  She is currently 208 lbs.  She says she is working hard to watch what she eats.  She is currently on Ozempic and she denies any side effects except for decreased appetite.   Relevant past medical, surgical, family and social history reviewed and updated as indicated. Interim medical history since our last visit reviewed. Allergies and medications reviewed and updated.  Review of Systems  Constitutional: Negative for fever, positive weight change.  Respiratory: Negative for cough and shortness of breath.   Cardiovascular: Negative for chest pain or palpitations.  Gastrointestinal: Negative for abdominal pain, no bowel changes.  Musculoskeletal: Negative for gait problem or joint swelling.  Skin: Negative for rash.  Neurological: Negative for dizziness or headache.  No other specific complaints in a complete review of systems (except as listed in HPI above).      Objective:    BP 118/72   Pulse 95   Temp 98.2 F (36.8 C)   Resp 18   Ht 6' (1.829 m)   Wt 208 lb 6.4 oz  (94.5 kg)   SpO2 99%   BMI 28.26 kg/m   Wt Readings from Last 3 Encounters:  02/24/21 208 lb 6.4 oz (94.5 kg)  02/17/21 218 lb (98.9 kg)  01/22/21 215 lb 3.2 oz (97.6 kg)    Physical Exam  Constitutional: Patient appears well-developed and well-nourished. Obese No distress.  HEENT: head atraumatic, normocephalic, pupils equal and reactive to light , neck supple Cardiovascular: Normal rate, regular rhythm and normal heart sounds.  No murmur heard. No BLE edema. Pulmonary/Chest: Effort normal and breath sounds normal. No respiratory distress. Abdominal: Soft.  There is no tenderness. Psychiatric: Patient has a normal mood and affect. behavior is normal. Judgment and thought content normal.   Results for orders placed or performed in visit on 01/22/21  Hemoglobin A1c  Result Value Ref Range   Hgb A1c MFr Bld 9.5 (H) <5.7 % of total Hgb   Mean Plasma Glucose 226 mg/dL   eAG (mmol/L) 12.5 mmol/L  COMPLETE METABOLIC PANEL WITH GFR  Result Value Ref Range   Glucose, Bld 239 (H) 65 - 99 mg/dL   BUN 9 7 - 25 mg/dL   Creat 0.59 0.50 - 0.96 mg/dL   eGFR 126 > OR = 60 mL/min/1.37m   BUN/Creatinine Ratio NOT APPLICABLE 6 -  22 (calc)   Sodium 136 135 - 146 mmol/L   Potassium 4.2 3.5 - 5.3 mmol/L   Chloride 101 98 - 110 mmol/L   CO2 24 20 - 32 mmol/L   Calcium 9.8 8.6 - 10.2 mg/dL   Total Protein 7.1 6.1 - 8.1 g/dL   Albumin 4.1 3.6 - 5.1 g/dL   Globulin 3.0 1.9 - 3.7 g/dL (calc)   AG Ratio 1.4 1.0 - 2.5 (calc)   Total Bilirubin 0.2 0.2 - 1.2 mg/dL   Alkaline phosphatase (APISO) 51 31 - 125 U/L   AST 40 (H) 10 - 30 U/L   ALT 23 6 - 29 U/L  Lipid panel  Result Value Ref Range   Cholesterol 189 <200 mg/dL   HDL 59 > OR = 50 mg/dL   Triglycerides 279 (H) <150 mg/dL   LDL Cholesterol (Calc) 90 mg/dL (calc)   Total CHOL/HDL Ratio 3.2 <5.0 (calc)   Non-HDL Cholesterol (Calc) 130 (H) <130 mg/dL (calc)  CBC with Differential/Platelet  Result Value Ref Range   WBC 7.6 3.8 - 10.8  Thousand/uL   RBC 4.36 3.80 - 5.10 Million/uL   Hemoglobin 12.6 11.7 - 15.5 g/dL   HCT 38.4 35.0 - 45.0 %   MCV 88.1 80.0 - 100.0 fL   MCH 28.9 27.0 - 33.0 pg   MCHC 32.8 32.0 - 36.0 g/dL   RDW 13.5 11.0 - 15.0 %   Platelets 376 140 - 400 Thousand/uL   MPV 11.0 7.5 - 12.5 fL   Neutro Abs 3,899 1,500 - 7,800 cells/uL   Lymphs Abs 3,116 850 - 3,900 cells/uL   Absolute Monocytes 319 200 - 950 cells/uL   Eosinophils Absolute 243 15 - 500 cells/uL   Basophils Absolute 23 0 - 200 cells/uL   Neutrophils Relative % 51.3 %   Total Lymphocyte 41.0 %   Monocytes Relative 4.2 %   Eosinophils Relative 3.2 %   Basophils Relative 0.3 %      Assessment & Plan:   1. Uncontrolled type 2 diabetes mellitus with hyperglycemia (HCC) - continue to monitor glucose - Semaglutide,0.25 or 0.5MG/DOS, (OZEMPIC, 0.25 OR 0.5 MG/DOSE,) 2 MG/1.5ML SOPN; Inject 0.25 mg into the skin once a week.  Dispense: 1.5 mL; Refill: 5   2. Overweight (BMI 25.0-29.9) -continue working eating healthy  Follow up plan: Return in about 2 months (around 04/26/2021) for follow up.

## 2021-02-27 DIAGNOSIS — J101 Influenza due to other identified influenza virus with other respiratory manifestations: Secondary | ICD-10-CM | POA: Diagnosis not present

## 2021-02-27 DIAGNOSIS — M791 Myalgia, unspecified site: Secondary | ICD-10-CM | POA: Diagnosis not present

## 2021-02-27 DIAGNOSIS — Z20822 Contact with and (suspected) exposure to covid-19: Secondary | ICD-10-CM | POA: Diagnosis not present

## 2021-03-05 ENCOUNTER — Encounter: Payer: Self-pay | Admitting: Nurse Practitioner

## 2021-03-25 ENCOUNTER — Ambulatory Visit (INDEPENDENT_AMBULATORY_CARE_PROVIDER_SITE_OTHER): Payer: BC Managed Care – PPO | Admitting: Dermatology

## 2021-03-25 ENCOUNTER — Other Ambulatory Visit: Payer: Self-pay

## 2021-03-25 VITALS — Wt 215.0 lb

## 2021-03-25 DIAGNOSIS — L853 Xerosis cutis: Secondary | ICD-10-CM

## 2021-03-25 DIAGNOSIS — K130 Diseases of lips: Secondary | ICD-10-CM | POA: Diagnosis not present

## 2021-03-25 DIAGNOSIS — L700 Acne vulgaris: Secondary | ICD-10-CM

## 2021-03-25 DIAGNOSIS — Z79899 Other long term (current) drug therapy: Secondary | ICD-10-CM | POA: Diagnosis not present

## 2021-03-25 MED ORDER — ISOTRETINOIN 40 MG PO CAPS: 40.0000 mg | ORAL_CAPSULE | Freq: Every day | ORAL | 0 refills | Status: DC

## 2021-03-25 NOTE — Progress Notes (Signed)
   Isotretinoin Follow-Up Visit   Subjective  Nicole Chambers is a 30 y.o. female who presents for the following: Follow-up (Patient reports a few breakouts at arms.  She reports just some dry skin. Currenly taking absorica 30 mg by mouth daily. ).  Not depression.   Week # 16   Isotretinoin F/U - 03/25/21 0800       Isotretinoin Follow Up   iPledge # [id number]    Date 03/25/21    Weight 215 lb (97.5 kg)    Two Forms of Birth Control Female Condom;Oral Contraceptives (w/ estrogen)    Acne breakouts since last visit? No      Dosage   Target Dosage (mg) 14835    Current (To Date) Dosage (mg) 3000    To Go Dosage (mg) 11835      Side Effects   Skin Dry Skin;Chapped Lips;Dry Lips    Gastrointestinal WNL    Neurological WNL    Constitutional WNL             Side effects: Dry skin, dry lips  Denies changes in night vision, shortness of breath, abdominal pain, nausea, vomiting, diarrhea, blood in stool or urine, visual changes, headaches, epistaxis, joint pain, myalgias, mood changes, depression, or suicidal ideation.   Patient is not pregnant, not seeking pregnancy, and not breastfeeding.   The following portions of the chart were reviewed this encounter and updated as appropriate: medications, allergies, medical history  Review of Systems:  No other skin or systemic complaints except as noted in HPI or Assessment and Plan.  Objective  Well appearing patient in no apparent distress; mood and affect are within normal limits.  An examination of the face, neck, chest, and back was performed and relevant findings are noted below.   face Hyperpigmented macules and few closed comedones on face    Assessment & Plan   Acne vulgaris face  Severe; On Isotretinoin -  requiring FDA mandated monthly evaluations and laboratory monitoring; Chronic and Persistent; Not to Goal   Wk #16 Isotretinoin  IPLEDGE# [id number] 2 forms of BC: Yaz, female latex condoms [name]    Total mg = 3000 mg Total mg/kg = 30.8 mg/kg  Increase to 40 mg Absorica qd , pt will monitor for signs/sx of depression Will check labs at next visit   Urine pregnancy test performed in office today and was negative.  Patient demonstrates comprehension and confirms she will not get pregnant.  LOT # [id number] exp 05/13/2022  Patient confirmed in iPledge and isotretinoin sent to pharmacy.   ISOtretinoin (ABSORICA) 40 MG capsule - face Take 1 capsule (40 mg total) by mouth daily.   Xerosis secondary to isotretinoin therapy - Continue emollients as directed  Cheilitis secondary to isotretinoin therapy - Continue lip balm as directed, Dr. Clayborne Artist Cortibalm recommended  [place] term medication management (isotretinoin) - While taking Isotretinoin and for 30 days after you finish the medication, do not get pregnant, do not share pills, do not donate blood. Isotretinoin is best absorbed when taken with a fatty meal. Isotretinoin can make you sensitive to the sun. Daily careful sun protection including sunscreen SPF 30+ when outdoors is recommended.  Follow-up in 30 days.  I, Asher Muir, CMA, am acting as scribe for Willeen Niece, MD.  Documentation: I have reviewed the above documentation for accuracy and completeness, and I agree with the above.  Willeen Niece MD

## 2021-03-25 NOTE — Patient Instructions (Addendum)
While taking Isotretinoin and for 30 days after you finish the medication, do not get pregnant, do not share pills, do not donate blood.  Generic isotretinoin is best absorbed when taken with a fatty meal. Isotretinoin can make you sensitive to the sun. Daily careful sun protection including sunscreen SPF 30+ when outdoors is recommended.  If You Need Anything After Your Visit  If you have any questions or concerns for your doctor, please call our main line at [phone] and press option 4 to reach your doctor's medical assistant. If no one answers, please leave a voicemail as directed and we will return your call as soon as possible. Messages left after 4 pm will be answered the following business day.   You may also send us a message via MyChart. We typically respond to MyChart messages within 1-2 business days.  For prescription refills, please ask your pharmacy to contact our office. Our fax number is [phone].  If you have an urgent issue when the clinic is closed that cannot wait until the next business day, you can page your doctor at the number below.    Please note that while we do our best to be available for urgent issues outside of office hours, we are not available 24/7.   If you have an urgent issue and are unable to reach us, you may choose to seek medical care at your doctor's office, retail clinic, urgent care center, or emergency room.  If you have a medical emergency, please immediately call 911 or go to the emergency department.  Pager Numbers  - Dr. Kowalski: [phone]  - Dr. Moye: [phone]  - Dr. Stewart: [phone]  In the event of inclement weather, please call our main line at [phone] for an update on the status of any delays or closures.  Dermatology Medication Tips: Please keep the boxes that topical medications come in in order to help keep track of the instructions about where and how to use these. Pharmacies typically print the medication  instructions only on the boxes and not directly on the medication tubes.   If your medication is too expensive, please contact our office at [phone] option 4 or send us a message through MyChart.   We are unable to tell what your co-pay for medications will be in advance as this is different depending on your insurance coverage. However, we may be able to find a substitute medication at lower cost or fill out paperwork to get insurance to cover a needed medication.   If a prior authorization is required to get your medication covered by your insurance company, please allow us 1-2 business days to complete this process.  Drug prices often vary depending on where the prescription is filled and some pharmacies may offer cheaper prices.  The website www.goodrx.com contains coupons for medications through different pharmacies. The prices here do not account for what the cost may be with help from insurance (it may be cheaper with your insurance), but the website can give you the price if you did not use any insurance.  - You can print the associated coupon and take it with your prescription to the pharmacy.  - You may also stop by our office during regular business hours and pick up a GoodRx coupon card.  - If you need your prescription sent electronically to a different pharmacy, notify our office through [facility] MyChart or by phone at [phone] option 4.     Si Usted Necesita Algo Despus de Su   Visita  Tambin puede enviarnos un mensaje a travs de MyChart. Por lo general respondemos a los mensajes de MyChart en el transcurso de 1 a 2 das hbiles.  Para renovar recetas, por favor pida a su farmacia que se ponga en contacto con nuestra oficina. Nuestro nmero de fax es el [phone].  Si tiene un asunto urgente cuando la clnica est cerrada y que no puede esperar hasta el siguiente da hbil, puede llamar/localizar a su doctor(a) al nmero que aparece a continuacin.   Por  favor, tenga en cuenta que aunque hacemos todo lo posible para estar disponibles para asuntos urgentes fuera del horario de oficina, no estamos disponibles las 24 horas del da, los 7 das de la semana.   Si tiene un problema urgente y no puede comunicarse con nosotros, puede optar por buscar atencin mdica  en el consultorio de su doctor(a), en una clnica privada, en un centro de atencin urgente o en una sala de emergencias.  Si tiene una emergencia mdica, por favor llame inmediatamente al 911 o vaya a la sala de emergencias.  Nmeros de bper  - Dr. Kowalski: [phone]  - Dra. Moye: [phone]  - Dra. Stewart: [phone]  En caso de inclemencias del tiempo, por favor llame a nuestra lnea principal al [phone] para una actualizacin sobre el estado de cualquier retraso o cierre.  Consejos para la medicacin en dermatologa: Por favor, guarde las cajas en las que vienen los medicamentos de uso tpico para ayudarle a seguir las instrucciones sobre dnde y cmo usarlos. Las farmacias generalmente imprimen las instrucciones del medicamento slo en las cajas y no directamente en los tubos del medicamento.   Si su medicamento es muy caro, por favor, pngase en contacto con nuestra oficina llamando al [phone] y presione la opcin 4 o envenos un mensaje a travs de MyChart.   No podemos decirle cul ser su copago por los medicamentos por adelantado ya que esto es diferente dependiendo de la cobertura de su seguro. Sin embargo, es posible que podamos encontrar un medicamento sustituto a menor costo o llenar un formulario para que el seguro cubra el medicamento que se considera necesario.   Si se requiere una autorizacin previa para que su compaa de seguros cubra su medicamento, por favor permtanos de 1 a 2 das hbiles para completar este proceso.  Los precios de los medicamentos varan con frecuencia dependiendo del lugar de dnde se surte la receta y alguna farmacias  pueden ofrecer precios ms baratos.  El sitio web www.goodrx.com tiene cupones para medicamentos de diferentes farmacias. Los precios aqu no tienen en cuenta lo que podra costar con la ayuda del seguro (puede ser ms barato con su seguro), pero el sitio web puede darle el precio si no utiliz ningn seguro.  - Puede imprimir el cupn correspondiente y llevarlo con su receta a la farmacia.  - Tambin puede pasar por nuestra oficina durante el horario de atencin regular y recoger una tarjeta de cupones de GoodRx.  - Si necesita que su receta se enve electrnicamente a una farmacia diferente, informe a nuestra oficina a travs de MyChart de [place] o por telfono llamando al [phone] y presione la opcin 4.  

## 2021-04-14 ENCOUNTER — Encounter: Payer: Self-pay | Admitting: Nurse Practitioner

## 2021-04-28 ENCOUNTER — Encounter: Payer: Self-pay | Admitting: Nurse Practitioner

## 2021-04-28 ENCOUNTER — Ambulatory Visit (INDEPENDENT_AMBULATORY_CARE_PROVIDER_SITE_OTHER): Payer: BC Managed Care – PPO | Admitting: Nurse Practitioner

## 2021-04-28 ENCOUNTER — Other Ambulatory Visit: Payer: Self-pay

## 2021-04-28 VITALS — BP 120/78 | HR 96 | Temp 98.3°F | Resp 16 | Ht 72.0 in | Wt 205.7 lb

## 2021-04-28 DIAGNOSIS — E1165 Type 2 diabetes mellitus with hyperglycemia: Secondary | ICD-10-CM | POA: Diagnosis not present

## 2021-04-28 DIAGNOSIS — F419 Anxiety disorder, unspecified: Secondary | ICD-10-CM

## 2021-04-28 DIAGNOSIS — E663 Overweight: Secondary | ICD-10-CM | POA: Diagnosis not present

## 2021-04-28 LAB — POCT GLYCOSYLATED HEMOGLOBIN (HGB A1C): Hemoglobin A1C: 7.8 % — AB (ref 4.0–5.6)

## 2021-04-28 MED ORDER — OZEMPIC (0.25 OR 0.5 MG/DOSE) 2 MG/1.5ML ~~LOC~~ SOPN
0.2500 mg | PEN_INJECTOR | SUBCUTANEOUS | 5 refills | Status: DC
  Filled 2021-04-28 – 2021-05-28 (×6): qty 1.5, 56d supply, fill #0
  Filled 2021-07-06: qty 1.5, 56d supply, fill #1
  Filled 2021-09-02: qty 1.5, 56d supply, fill #2

## 2021-04-28 MED ORDER — HYDROXYZINE PAMOATE 25 MG PO CAPS: 25.0000 mg | ORAL_CAPSULE | Freq: Every evening | ORAL | 0 refills | Status: DC | PRN

## 2021-04-28 NOTE — Progress Notes (Signed)
BP 120/78    Pulse 96    Temp 98.3 F (36.8 C)    Resp 16    Ht 6' (1.829 m)    Wt 205 lb 11.2 oz (93.3 kg)    LMP 04/27/2021    SpO2 99%    BMI 27.90 kg/m    Subjective:    Patient ID: Nicole Chambers, female    DOB: 05-17-1990, 31 y.o.   MRN: [id number]  HPI: Nicole Chambers is a 31 y.o. female  Chief Complaint  Patient presents with   Follow-up   Diabetes   DM:  Her A1C was 9.5 on 01/22/21, today it is 7.8.  She says her blood sugars have been running around 140-170.  She is currently taking Metformin 1000 mg BID and ozempic 0.25 mg weekly.  Discussed increasing ozempic dose to 0.5 mg, she is going to try that on Wednesday and see if she tolerates it. She denies any polyuria, polydipsia or polyphagia.  She also denies any hypoglycemic episodes.   Overweight:  She is currently on ozempic 0.25 mg weekly, she is trying to eat health and stay physically active.  She was 215 lbs on 03/25/2021 and today she is 205 lbs.  She says she is really working hard to be healthy.   Anxiety:  She says she just started going back to school and her anxiety is really bothering her.  She says she is having trouble sleeping too because she is worrying about every thing.  GAD score is 14.  Her depression screening is negative. She would like to try medication.  Discussed trying hydroxyzine and she is agreeable to plan.   GAD 7 : Generalized Anxiety Score 04/28/2021  Nervous, Anxious, on Edge 2  Control/stop worrying 2  Worry too much - different things 2  Trouble relaxing 2  Restless 2  Easily annoyed or irritable 2  Afraid - awful might happen 2  Total GAD 7 Score 14  Anxiety Difficulty Somewhat difficult     Depression screen [facility] 2/9 04/28/2021 02/24/2021 01/22/2021 01/22/2021 12/11/2020  Decreased Interest 1 0 2 2 0  Down, Depressed, Hopeless 0 0 0 0 0  PHQ - 2 Score 1 0 2 2 0  Altered sleeping 0 0 3 - -  Tired, decreased energy 0 0 3 - -  Change in appetite 0 0 3 - -  Feeling bad or failure about  yourself  0 0 0 - -  Trouble concentrating 0 0 3 - -  Moving slowly or fidgety/restless 0 0 0 - -  Suicidal thoughts 0 0 0 - -  PHQ-9 Score 1 0 14 - -  Difficult doing work/chores Not difficult at all - Somewhat difficult - -       Relevant past medical, surgical, family and social history reviewed and updated as indicated. Interim medical history since our last visit reviewed. Allergies and medications reviewed and updated.  Review of Systems  Constitutional: Negative for fever or weight change.  Respiratory: Negative for cough and shortness of breath.   Cardiovascular: Negative for chest pain or palpitations.  Gastrointestinal: Negative for abdominal pain, no bowel changes.  Musculoskeletal: Negative for gait problem or joint swelling.  Skin: Negative for rash.  Neurological: Negative for dizziness or headache.  No other specific complaints in a complete review of systems (except as listed in HPI above).      Objective:    BP 120/78    Pulse 96    Temp 98.3 F (  36.8 C)    Resp 16    Ht 6' (1.829 m)    Wt 205 lb 11.2 oz (93.3 kg)    LMP 04/27/2021    SpO2 99%    BMI 27.90 kg/m   Wt Readings from Last 3 Encounters:  04/28/21 205 lb 11.2 oz (93.3 kg)  03/25/21 215 lb (97.5 kg)  02/24/21 208 lb 6.4 oz (94.5 kg)    Physical Exam  Constitutional: Patient appears well-developed and well-nourished. Overweight No distress.  HEENT: head atraumatic, normocephalic, pupils equal and reactive to light, neck supple Cardiovascular: Normal rate, regular rhythm and normal heart sounds.  No murmur heard. No BLE edema. Pulmonary/Chest: Effort normal and breath sounds normal. No respiratory distress. Abdominal: Soft.  There is no tenderness. Psychiatric: Patient has a normal mood and affect. behavior is normal. Judgment and thought content normal.  Results for orders placed or performed in visit on 04/28/21  POCT HgB A1C  Result Value Ref Range   Hemoglobin A1C 7.8 (A) 4.0 - 5.6 %   HbA1c  POC (<> result, manual entry)     HbA1c, POC (prediabetic range)     HbA1c, POC (controlled diabetic range)        Assessment & Plan:   1. Uncontrolled type 2 diabetes mellitus with hyperglycemia (HCC)  - POCT HgB A1C - Semaglutide,0.25 or 0.5MG /DOS, (OZEMPIC, 0.25 OR 0.5 MG/DOSE,) 2 MG/1.5ML SOPN; Inject 0.25 mg into the skin once a week.  Dispense: 1.5 mL; Refill: 5  2. Overweight (BMI 25.0-29.9) -continue current treatment  3. Anxiety  - hydrOXYzine (VISTARIL) 25 MG capsule; Take 1 capsule (25 mg total) by mouth at bedtime as needed.  Dispense: 30 capsule; Refill: 0   Follow up plan: Return in about 4 weeks (around 05/26/2021) for follow up for anxiety, three month follow up for DM.

## 2021-05-05 ENCOUNTER — Other Ambulatory Visit: Payer: Self-pay

## 2021-05-05 ENCOUNTER — Ambulatory Visit (INDEPENDENT_AMBULATORY_CARE_PROVIDER_SITE_OTHER): Payer: BC Managed Care – PPO | Admitting: Dermatology

## 2021-05-05 VITALS — Wt 205.0 lb

## 2021-05-05 DIAGNOSIS — L853 Xerosis cutis: Secondary | ICD-10-CM | POA: Diagnosis not present

## 2021-05-05 DIAGNOSIS — L700 Acne vulgaris: Secondary | ICD-10-CM | POA: Diagnosis not present

## 2021-05-05 DIAGNOSIS — K130 Diseases of lips: Secondary | ICD-10-CM | POA: Diagnosis not present

## 2021-05-05 DIAGNOSIS — Z79899 Other long term (current) drug therapy: Secondary | ICD-10-CM | POA: Diagnosis not present

## 2021-05-05 NOTE — Patient Instructions (Signed)

## 2021-05-05 NOTE — Progress Notes (Signed)
° °  Isotretinoin Follow-Up Visit   Subjective  Nicole Chambers is a 31 y.o. female who presents for the following: Acne (Face, week 20 Isotretinoin, Absorica 40mg  1 po qd).  Tolerating well.  No depression.  Week # 20   Isotretinoin F/U - 05/05/21 1300       Isotretinoin Follow Up   iPledge # 05/07/21    Date 05/05/21    Weight 205 lb (93 kg)    Two Forms of Birth Control Female Condom;Oral Contraceptives (w/ estrogen)    Acne breakouts since last visit? No      Dosage   Target Dosage (mg) 14835    Current (To Date) Dosage (mg) 4200    To Go Dosage (mg) 10635      Side Effects   Skin Chapped Lips;Dry Nose    Gastrointestinal WNL    Neurological WNL    Constitutional WNL             Side effects: Dry skin, dry lips  Denies changes in night vision, shortness of breath, abdominal pain, nausea, vomiting, diarrhea, blood in stool or urine, visual changes, headaches, epistaxis, joint pain, myalgias, mood changes, depression, or suicidal ideation.   Patient is not pregnant, not seeking pregnancy, and not breastfeeding.   The following portions of the chart were reviewed this encounter and updated as appropriate: medications, allergies, medical history  Review of Systems:  No other skin or systemic complaints except as noted in HPI or Assessment and Plan.  Objective  Well appearing patient in no apparent distress; mood and affect are within normal limits.  An examination of the face, neck, chest, and back was performed and relevant findings are noted below.   face Hyperpigmented macules cheeks, closed comedones cheeks, forehead, chin    Assessment & Plan   Acne vulgaris face  Acne is Severe; chronic and persistent; not at goal. Patient is on Isotretinoin -  requiring FDA mandated monthly evaluations and laboratory monitoring.   Wk #20 Isotretinoin  IPLEDGE# 05/07/21 2 forms of BC: Yaz, female latex condoms [name]   Total mg = 4,200 mg Total mg/kg =  45.02 mg/kg  Pending labs cont Absorica 40mg  1 po qd, due to depression symptoms at higher dose    Related Procedures Comprehensive metabolic panel Lipid panel hCG, serum, qualitative  Related Medications ISOtretinoin (ABSORICA) 40 MG capsule Take 1 capsule (40 mg total) by mouth daily.    Xerosis secondary to isotretinoin therapy - Continue emollients as directed  Cheilitis secondary to isotretinoin therapy - Continue lip balm as directed, Dr. [phone] Cortibalm recommended  [place] term medication management (isotretinoin) - While taking Isotretinoin and for 30 days after you finish the medication, do not get pregnant, do not share pills, do not donate blood. Isotretinoin is best absorbed when taken with a fatty meal. Isotretinoin can make you sensitive to the sun. Daily careful sun protection including sunscreen SPF 30+ when outdoors is recommended.  Follow-up in 30 days.  I, , RMA, am acting as scribe for Clayborne Artist, MD .  Documentation: I have reviewed the above documentation for accuracy and completeness, and I agree with the above.  Ardis Rowan MD

## 2021-05-06 ENCOUNTER — Telehealth: Payer: Self-pay

## 2021-05-06 LAB — COMPREHENSIVE METABOLIC PANEL
ALT: 20 IU/L (ref 0–32)
AST: 25 IU/L (ref 0–40)
Albumin/Globulin Ratio: 1.3 (ref 1.2–2.2)
Albumin: 4.1 g/dL (ref 3.9–5.0)
Alkaline Phosphatase: 63 IU/L (ref 44–121)
BUN/Creatinine Ratio: 17 (ref 9–23)
BUN: 10 mg/dL (ref 6–20)
Bilirubin Total: 0.2 mg/dL (ref 0.0–1.2)
CO2: 22 mmol/L (ref 20–29)
Calcium: 9.7 mg/dL (ref 8.7–10.2)
Chloride: 98 mmol/L (ref 96–106)
Creatinine, Ser: 0.6 mg/dL (ref 0.57–1.00)
Globulin, Total: 3.1 g/dL (ref 1.5–4.5)
Glucose: 214 mg/dL — ABNORMAL HIGH (ref 70–99)
Potassium: 4 mmol/L (ref 3.5–5.2)
Sodium: 136 mmol/L (ref 134–144)
Total Protein: 7.2 g/dL (ref 6.0–8.5)
eGFR: 125 mL/min/{1.73_m2} (ref >59)

## 2021-05-06 LAB — LIPID PANEL
Chol/HDL Ratio: 3.9 ratio (ref 0.0–4.4)
Cholesterol, Total: 244 mg/dL — ABNORMAL HIGH (ref 100–199)
HDL: 62 mg/dL (ref >39)
LDL Chol Calc (NIH): 143 mg/dL — ABNORMAL HIGH (ref 0–99)
Triglycerides: 217 mg/dL — ABNORMAL HIGH (ref 0–149)
VLDL Cholesterol Cal: 39 mg/dL (ref 5–40)

## 2021-05-06 LAB — HCG, SERUM, QUALITATIVE: hCG,Beta Subunit,Qual,Serum: NEGATIVE m[IU]/mL (ref <6)

## 2021-05-06 MED ORDER — ISOTRETINOIN 40 MG PO CAPS: 40.0000 mg | ORAL_CAPSULE | Freq: Every day | ORAL | 0 refills | Status: DC

## 2021-05-06 NOTE — Telephone Encounter (Signed)
Called pt to advise of lab results and no answer and no voicemail.  Pt has been confirmed in IPLEDGE program and Absorica 40mg  1 po qd has been sent to [facility].

## 2021-05-06 NOTE — Telephone Encounter (Signed)
-----   Message from Willeen Niece, MD sent at 05/06/2021  8:45 AM EST ----- Labs okay,  Tgs/chol slightly elevated, preg neg. Continue isotretinoin 40 mg PO qd as per note - please call patient

## 2021-05-06 NOTE — Telephone Encounter (Signed)
Advised pt of lab results.  Advised she would need to answer her questions in the [facility] program and the Absorica had been sent into [facility]./sh

## 2021-05-09 ENCOUNTER — Other Ambulatory Visit: Payer: Self-pay

## 2021-05-14 ENCOUNTER — Encounter: Payer: Self-pay | Admitting: Nurse Practitioner

## 2021-05-14 ENCOUNTER — Other Ambulatory Visit: Payer: Self-pay

## 2021-05-15 ENCOUNTER — Other Ambulatory Visit: Payer: Self-pay

## 2021-05-16 ENCOUNTER — Other Ambulatory Visit: Payer: Self-pay

## 2021-05-28 ENCOUNTER — Other Ambulatory Visit: Payer: Self-pay

## 2021-05-29 ENCOUNTER — Telehealth (INDEPENDENT_AMBULATORY_CARE_PROVIDER_SITE_OTHER): Payer: BC Managed Care – PPO | Admitting: Nurse Practitioner

## 2021-05-29 ENCOUNTER — Encounter: Payer: Self-pay | Admitting: Nurse Practitioner

## 2021-05-29 ENCOUNTER — Other Ambulatory Visit: Payer: Self-pay

## 2021-05-29 DIAGNOSIS — E1165 Type 2 diabetes mellitus with hyperglycemia: Secondary | ICD-10-CM | POA: Diagnosis not present

## 2021-05-29 DIAGNOSIS — F419 Anxiety disorder, unspecified: Secondary | ICD-10-CM

## 2021-05-29 DIAGNOSIS — E782 Mixed hyperlipidemia: Secondary | ICD-10-CM

## 2021-05-29 MED ORDER — HYDROXYZINE PAMOATE 25 MG PO CAPS: 25.0000 mg | ORAL_CAPSULE | Freq: Every evening | ORAL | 1 refills | Status: DC | PRN

## 2021-05-29 MED ORDER — ATORVASTATIN CALCIUM 10 MG PO TABS: 10.0000 mg | ORAL_TABLET | Freq: Every day | ORAL | 3 refills | Status: DC

## 2021-05-29 NOTE — Progress Notes (Signed)
Name: Nicole Chambers   MRN: [id number]    DOB: 05-19-1990   Date:05/29/2021       Progress Note  Subjective  Chief Complaint  Chief Complaint  Patient presents with   Diabetes    Follow up   Anxiety    I connected with  Nicole Chambers  on 05/29/21 at  1:20 PM EST by a telephone enabled telemedicine application and verified that I am speaking with the correct person using two identifiers.  I discussed the limitations of evaluation and management by telemedicine and the availability of in person appointments. The patient expressed understanding and agreed to proceed with a virtual visit  Staff also discussed with the patient that there may be a patient responsible charge related to this service. Patient Location: work Restaurant manager, fast food Location: [facility] Additional Individuals present: alone  HPI  Anxiety: At her last visit her GAD score was 14.  She said that she had just started going back to school and her anxiety had increased and that she was having a hard time sleeping. She was started on hydroxyzine 25 mg PRN at bedtime. She says she has been doing well with the medication.  Will continue current treatment.   GAD 7 : Generalized Anxiety Score 05/29/2021 04/28/2021  Nervous, Anxious, on Edge 0 2  Control/stop worrying 0 2  Worry too much - different things 0 2  Trouble relaxing 0 2  Restless 0 2  Easily annoyed or irritable 0 2  Afraid - awful might happen 0 2  Total GAD 7 Score 0 14  Anxiety Difficulty Not difficult at all Somewhat difficult     DM2: On 04/28/2021 her A1C was 7.8.  We increased her dose of ozempic to 0.5 mg weekly.  She says she did go back to taking 0.25 mg weekly because she did get nauseated. She is also taking Metformin 1000 mg BID. Her blood sugars have been running around 120-130.  She denies any hypoglycemic episodes, polyuria, polydipsia or polyphagia.   Hyperlipidemia:  She says she was taking atorvastatin but she has been out of it.  Her last LDL was 143 on  05/05/2021.  Will send in refill.   Patient Active Problem List   Diagnosis Date Noted   Mixed hyperlipidemia 01/22/2021   Menorrhagia with regular cycle 01/22/2021   Uncontrolled type 2 diabetes mellitus with hyperglycemia (HCC) 10/21/2020   Allergic rhinitis 05/23/2019   Reactive airway disease with acute exacerbation 05/23/2019   Class 1 obesity without serious comorbidity with body mass index (BMI) of 30.0 to 30.9 in adult 05/23/2019    Social History   Tobacco Use   Smoking status: Never   Smokeless tobacco: Never   Tobacco comments:    tried cigarettes as a teenager  Substance Use Topics   Alcohol use: Yes    Comment: wine occass 1-2 x a month, sometimes a beer     Current Outpatient Medications:    albuterol (VENTOLIN HFA) 108 (90 Base) MCG/ACT inhaler, Inhale 2 puffs into the lungs every 6 (six) hours as needed for wheezing or shortness of breath (chest tightness, coughing fit)., Disp: 18 g, Rfl: 0   atorvastatin (LIPITOR) 10 MG tablet, Take 1 tablet (10 mg total) by mouth at bedtime., Disp: 90 tablet, Rfl: 3   hydrOXYzine (VISTARIL) 25 MG capsule, Take 1 capsule (25 mg total) by mouth at bedtime as needed., Disp: 30 capsule, Rfl: 0   ISOtretinoin (ABSORICA) 40 MG capsule, Take 1 capsule (40  mg total) by mouth daily., Disp: 30 capsule, Rfl: 0   metFORMIN (GLUCOPHAGE) 500 MG tablet, Take 2 tablets (1,000 mg total) by mouth 2 (two) times daily with a meal., Disp: 270 tablet, Rfl: 1   norgestimate-ethinyl estradiol (ORTHO-CYCLEN) 0.25-35 MG-MCG tablet, Take 1 tablet by mouth daily., Disp: 90 tablet, Rfl: 3   Semaglutide,0.25 or 0.5MG /DOS, (OZEMPIC, 0.25 OR 0.5 MG/DOSE,) 2 MG/1.5ML SOPN, Inject 0.25 mg into the skin once a week., Disp: 1.5 mL, Rfl: 5  No Known Allergies  I personally reviewed active problem list, medication list, allergies, notes from last encounter with the patient/caregiver today.  ROS  Constitutional: Negative for fever or weight change.  Respiratory:  Negative for cough and shortness of breath.   Cardiovascular: Negative for chest pain or palpitations.  Gastrointestinal: Negative for abdominal pain, no bowel changes.  Musculoskeletal: Negative for gait problem or joint swelling.  Skin: Negative for rash.  Neurological: Negative for dizziness or headache.  No other specific complaints in a complete review of systems (except as listed in HPI above).   Objective  Virtual encounter, vitals not obtained.  There is no height or weight on file to calculate BMI.  Nursing Note and Vital Signs reviewed.  Physical Exam  Awake, alert and oriented, speaking in complete sentences  No results found for this or any previous visit (from the past 72 hour(s)).  Assessment & Plan  1. Anxiety -continue current treatment - hydrOXYzine (VISTARIL) 25 MG capsule; Take 1 capsule (25 mg total) by mouth at bedtime as needed.  Dispense: 90 capsule; Refill: 1  2. Uncontrolled type 2 diabetes mellitus with hyperglycemia (HCC) -continue current treatment - atorvastatin (LIPITOR) 10 MG tablet; Take 1 tablet (10 mg total) by mouth at bedtime.  Dispense: 90 tablet; Refill: 3  3. Mixed hyperlipidemia -continue current treatment - atorvastatin (LIPITOR) 10 MG tablet; Take 1 tablet (10 mg total) by mouth at bedtime.  Dispense: 90 tablet; Refill: 3    -Red flags and when to present for emergency care or RTC including fever >101.17F, chest pain, shortness of breath, new/worsening/un-resolving symptoms,  reviewed with patient at time of visit. Follow up and care instructions discussed and provided in AVS. - I discussed the assessment and treatment plan with the patient. The patient was provided an opportunity to ask questions and all were answered. The patient agreed with the plan and demonstrated an understanding of the instructions.  I provided 20 minutes of non-face-to-face time during this encounter.  Berniece Salines, FNP

## 2021-06-05 ENCOUNTER — Other Ambulatory Visit: Payer: Self-pay

## 2021-06-10 ENCOUNTER — Other Ambulatory Visit: Payer: Self-pay | Admitting: Advanced Practice Midwife

## 2021-06-10 ENCOUNTER — Ambulatory Visit (INDEPENDENT_AMBULATORY_CARE_PROVIDER_SITE_OTHER): Payer: BC Managed Care – PPO | Admitting: Dermatology

## 2021-06-10 ENCOUNTER — Other Ambulatory Visit: Payer: Self-pay

## 2021-06-10 VITALS — Wt 205.0 lb

## 2021-06-10 DIAGNOSIS — Z79899 Other long term (current) drug therapy: Secondary | ICD-10-CM

## 2021-06-10 DIAGNOSIS — L308 Other specified dermatitis: Secondary | ICD-10-CM | POA: Diagnosis not present

## 2021-06-10 DIAGNOSIS — L219 Seborrheic dermatitis, unspecified: Secondary | ICD-10-CM

## 2021-06-10 DIAGNOSIS — L853 Xerosis cutis: Secondary | ICD-10-CM

## 2021-06-10 DIAGNOSIS — L700 Acne vulgaris: Secondary | ICD-10-CM | POA: Diagnosis not present

## 2021-06-10 DIAGNOSIS — K130 Diseases of lips: Secondary | ICD-10-CM

## 2021-06-10 DIAGNOSIS — N920 Excessive and frequent menstruation with regular cycle: Secondary | ICD-10-CM

## 2021-06-10 MED ORDER — ISOTRETINOIN 40 MG PO CAPS: 40.0000 mg | ORAL_CAPSULE | Freq: Every day | ORAL | 0 refills | Status: AC

## 2021-06-10 MED ORDER — TRIAMCINOLONE ACETONIDE 0.1 % EX CREA: 1.0000 "application " | TOPICAL_CREAM | Freq: Two times a day (BID) | CUTANEOUS | 1 refills | Status: DC | PRN

## 2021-06-10 MED ORDER — KETOCONAZOLE 2 % EX SHAM: 1.0000 "application " | MEDICATED_SHAMPOO | Freq: Once | CUTANEOUS | 1 refills | Status: AC

## 2021-06-10 NOTE — Patient Instructions (Signed)

## 2021-06-10 NOTE — Progress Notes (Signed)
Isotretinoin Follow-Up Visit   Subjective  Nicole Chambers is a 31 y.o. female who presents for the following: Acne (Patient here today for 30 day isotretinoin follow up. Patient currently on 40 mg daily. Patient c/o dry skin/rash at hands and elbows. ).  Week # 24 Oral BC pills, condoms iPledge # [id number] [name] Total mg/kg - 58.06 mg/kg   Isotretinoin F/U - 06/10/21 1400       Isotretinoin Follow Up   iPledge # [id number]    Date 06/10/21    Weight 205 lb (93 kg)    Two Forms of Birth Control Female Condom;Oral Contraceptives (w/ estrogen)      Dosage   Target Dosage (mg) 14835    Current (To Date) Dosage (mg) 5400    To Go Dosage (mg) 9435             Side effects: Dry skin, dry lips  Denies changes in night vision, shortness of breath, abdominal pain, nausea, vomiting, diarrhea, blood in stool or urine, visual changes, headaches, epistaxis, joint pain, myalgias, mood changes, depression, or suicidal ideation.   Patient is not pregnant, not seeking pregnancy, and not breastfeeding.   The following portions of the chart were reviewed this encounter and updated as appropriate: medications, allergies, medical history  Review of Systems:  No other skin or systemic complaints except as noted in HPI or Assessment and Plan.  Objective  Well appearing patient in no apparent distress; mood and affect are within normal limits.  An examination of the face, neck, chest, and back was performed and relevant findings are noted below.   face Face is clear  BL wrists, R elbow Hyperpigmented scaly patches with erythema at bilateral wrist, right elbow  Scalp Patches of greasy scaling    Assessment & Plan   Acne vulgaris face  Acne is Severe; chronic and persistent; not at goal. Patient is on Isotretinoin -  requiring FDA mandated monthly evaluations and laboratory monitoring.  Patient's depression stable on current dose.   Week # 24 Oral BC pills,  condoms iPledge # [id number] [name] Total mg/kg - 58.06 mg/kg   Continue isotretinoin 40 mg once daily.   Urine pregnancy test performed in office today and was negative.  Patient demonstrates comprehension and confirms she will not get pregnant.   Patient confirmed in iPledge and isotretinoin sent to pharmacy.   ISOtretinoin (ACCUTANE) 40 MG capsule - face Take 1 capsule (40 mg total) by mouth daily.  Other eczema BL wrists, R elbow  Likely due to isotretinoin treatment  Start TMC 0.1% cream twice daily to affected areas as needed for rash. Avoid applying to face, groin, and axilla. Use as directed. [place]-term use can cause thinning of the skin.  Recommend mild soap and moisturizing cream 1-2 times daily.   Topical steroids (such as triamcinolone, fluocinolone, fluocinonide, mometasone, clobetasol, halobetasol, betamethasone, hydrocortisone) can cause thinning and lightening of the skin if they are used for too [place] in the same area. Your physician has selected the right strength medicine for your problem and area affected on the body. Please use your medication only as directed by your physician to prevent side effects.    triamcinolone cream (KENALOG) 0.1 % - BL wrists, R elbow Apply 1 application topically 2 (two) times daily as needed. For rash. Avoid applying to face, groin, and axilla. Use as directed. [place]-term use can cause thinning of the skin.  Seborrheic dermatitis Scalp  Start ketoconazole 2% shampoo apply once a week,  massage into scalp and leave in for 10 minutes before rinsing out  Apply TMC 0.1% cream to aas qd/bid prn itch  Seborrheic Dermatitis  -  is a chronic persistent rash characterized by pinkness and scaling most commonly of the mid face but also can occur on the scalp (dandruff), ears; mid chest, mid back and groin.  It tends to be exacerbated by stress and cooler weather.  People who have neurologic disease may experience new onset or  exacerbation of existing seborrheic dermatitis.  The condition is not curable but treatable and can be controlled.   ketoconazole (NIZORAL) 2 % shampoo - Scalp Apply 1 application topically once for 1 dose. Apply once a week, massage into scalp and leave in for 10 minutes before rinsing out    Xerosis secondary to isotretinoin therapy - Continue emollients as directed  Cheilitis secondary to isotretinoin therapy - Continue lip balm as directed, Dr. Clayborne Artist Cortibalm recommended  [place] term medication management (isotretinoin) - While taking Isotretinoin and for 30 days after you finish the medication, do not get pregnant, do not share pills, do not donate blood. Isotretinoin is best absorbed when taken with a fatty meal. Isotretinoin can make you sensitive to the sun. Daily careful sun protection including sunscreen SPF 30+ when outdoors is recommended.  Follow-up in 30 days.  Anise Salvo, RMA, am acting as scribe for Nicole Niece, MD .  Documentation: I have reviewed the above documentation for accuracy and completeness, and I agree with the above.  Nicole Niece MD

## 2021-06-27 DIAGNOSIS — K529 Noninfective gastroenteritis and colitis, unspecified: Secondary | ICD-10-CM | POA: Diagnosis not present

## 2021-07-07 ENCOUNTER — Other Ambulatory Visit: Payer: Self-pay

## 2021-07-09 ENCOUNTER — Other Ambulatory Visit: Payer: Self-pay

## 2021-07-14 ENCOUNTER — Emergency Department: Payer: BC Managed Care – PPO

## 2021-07-14 ENCOUNTER — Encounter: Payer: Self-pay | Admitting: Emergency Medicine

## 2021-07-14 ENCOUNTER — Emergency Department
Admission: EM | Admit: 2021-07-14 | Discharge: 2021-07-14 | Disposition: A | Discharge status: Home / Self Care | Payer: BC Managed Care – PPO | Attending: Emergency Medicine | Admitting: Emergency Medicine

## 2021-07-14 ENCOUNTER — Other Ambulatory Visit: Payer: Self-pay

## 2021-07-14 ENCOUNTER — Telehealth: Payer: Self-pay

## 2021-07-14 DIAGNOSIS — R0789 Other chest pain: Secondary | ICD-10-CM | POA: Diagnosis not present

## 2021-07-14 DIAGNOSIS — E119 Type 2 diabetes mellitus without complications: Secondary | ICD-10-CM | POA: Diagnosis not present

## 2021-07-14 DIAGNOSIS — J45909 Unspecified asthma, uncomplicated: Secondary | ICD-10-CM | POA: Insufficient documentation

## 2021-07-14 DIAGNOSIS — R079 Chest pain, unspecified: Secondary | ICD-10-CM | POA: Diagnosis not present

## 2021-07-14 LAB — CBC
HCT: 36.3 % (ref 36.0–46.0)
Hemoglobin: 12.1 g/dL (ref 12.0–15.0)
MCH: 29.2 pg (ref 26.0–34.0)
MCHC: 33.3 g/dL (ref 30.0–36.0)
MCV: 87.7 fL (ref 80.0–100.0)
Platelets: 432 10*3/uL — ABNORMAL HIGH (ref 150–400)
RBC: 4.14 MIL/uL (ref 3.87–5.11)
RDW: 12.9 % (ref 11.5–15.5)
WBC: 6 10*3/uL (ref 4.0–10.5)
nRBC: 0 % (ref 0.0–0.2)

## 2021-07-14 LAB — BASIC METABOLIC PANEL
Anion gap: 9 (ref 5–15)
BUN: 11 mg/dL (ref 6–20)
CO2: 20 mmol/L — ABNORMAL LOW (ref 22–32)
Calcium: 9 mg/dL (ref 8.9–10.3)
Chloride: 105 mmol/L (ref 98–111)
Creatinine, Ser: 0.49 mg/dL (ref 0.44–1.00)
GFR, Estimated: >60 mL/min (ref >60)
Glucose, Bld: 201 mg/dL — ABNORMAL HIGH (ref 70–99)
Potassium: 3.6 mmol/L (ref 3.5–5.1)
Sodium: 134 mmol/L — ABNORMAL LOW (ref 135–145)

## 2021-07-14 LAB — TROPONIN I (HIGH SENSITIVITY): Troponin I (High Sensitivity): 2 ng/L (ref <18)

## 2021-07-14 LAB — POC URINE PREG, ED: Preg Test, Ur: NEGATIVE

## 2021-07-14 LAB — D-DIMER, QUANTITATIVE: D-Dimer, Quant: 0.4 ug/mL-FEU (ref 0.00–0.50)

## 2021-07-14 NOTE — Discharge Instructions (Addendum)
Your tests today are all normal.  We are unable to find a cause for your pain, but your evaluation in the emergency department today is generally reassuring.  Please follow-up with your primary care doctor for continued evaluation of the symptoms. ?

## 2021-07-14 NOTE — Telephone Encounter (Signed)
Received call a nurse message from 07-14-2021 7:01 for an appt. Said call could not be completed at this time.Patient needing an appt

## 2021-07-14 NOTE — ED Provider Notes (Signed)
? ?[facility] ?Provider Note ? ? ? Event Date/Time  ? First MD Initiated Contact with Patient 07/14/21 0800   ?  (approximate) ? ? ?History  ? ?Chest Pain ? ? ?HPI ? ?Nicole Chambers is a 31 y.o. female with a history of type 2 diabetes, reactive airway disease who comes the ED complaining of intermittent chest pain for the past week.  Lasts about 5 minutes at a time.  Occurs at random but also is provoked by walking upstairs.  No shortness of breath.  Nonradiating.  Sharp.  No pain with deep breathing.  No vomiting or diaphoresis.  Never had pain like this before. ? ?No recent travel trauma hospitalization or surgery.  No leg pain or swelling.  She does take oral contraceptives.  No history of DVT or PE. ?  ? ? ?Physical Exam  ? ?Triage Vital Signs: ?ED Triage Vitals  ?Enc Vitals Group  ?   BP 07/14/21 0753 (!) 123/91  ?   Pulse Rate 07/14/21 0753 84  ?   Resp 07/14/21 0753 18  ?   Temp 07/14/21 0753 98.1 ?F (36.7 ?C)  ?   Temp Source 07/14/21 0753 Oral  ?   SpO2 07/14/21 0753 98 %  ?   Weight 07/14/21 0751 200 lb (90.7 kg)  ?   Height 07/14/21 0751 6' (1.829 m)  ?   Head Circumference --   ?   Peak Flow --   ?   Pain Score 07/14/21 0751 5  ?   Pain Loc --   ?   Pain Edu? --   ?   Excl. in [place]? --   ? ? ?Most recent vital signs: ?Vitals:  ? 07/14/21 0753  ?BP: (!) 123/91  ?Pulse: 84  ?Resp: 18  ?Temp: 98.1 ?F (36.7 ?C)  ?SpO2: 98%  ? ? ? ?General: Awake, no distress.  ?CV:  Good peripheral perfusion.  Regular rate and rhythm ?Resp:  Normal effort.  Clear to auscultation bilaterally.  No wheezing or crackles, no inducible wheezing or crackles with FEV1 maneuver. ?Abd:  No distention.  ?Other:  No lower extremity edema or calf tenderness.  Negative Homans' sign.  Symmetric pulses ? ? ?ED Results / Procedures / Treatments  ? ?Labs ?(all labs ordered are listed, but only abnormal results are displayed) ?Labs Reviewed  ?BASIC METABOLIC PANEL - Abnormal; Notable for the following components:  ?     Result Value  ? Sodium 134 (*)   ? CO2 20 (*)   ? Glucose, Bld 201 (*)   ? All other components within normal limits  ?CBC - Abnormal; Notable for the following components:  ? Platelets 432 (*)   ? All other components within normal limits  ?D-DIMER, QUANTITATIVE  ?POC URINE PREG, ED  ?TROPONIN I (HIGH SENSITIVITY)  ? ? ? ?EKG ? ?Interpreted by me ?Normal sinus rhythm rate of 78.  Normal axis intervals QRS ST segments and T waves.  No evidence of right heart strain. ? ? ?RADIOLOGY ?Chest x-ray viewed and interpreted by me, appears normal.  Radiology report reviewed ? ? ? ?PROCEDURES: ? ?Critical Care performed: No ? ?Procedures ? ? ?MEDICATIONS ORDERED IN ED: ?Medications - No data to display ? ? ?IMPRESSION / MDM / ASSESSMENT AND PLAN / ED COURSE  ?I reviewed the triage vital signs and the nursing notes. ?             ?               ? ?  Differential diagnosis includes, but is not limited to, bronchospasm, episodic fatigue, atypical pneumonia, pulmonary embolism ? ? ? ?Patient presents with atypical chest pain.  Doubt ACS, carditis, pericardial effusion, dissection.  She is nontoxic, reassuring vital signs and exam.  Chest x-ray is unremarkable, and with the nature of the pain and oral contraceptive use, will need to screen for VTE with D-dimer. ? ?Clinical Course as of 07/14/21 0939  ?Mon Jul 14, 2021  ?0937 D-dimer is within normal limits.   Considering the patient's symptoms, medical history, and physical examination today, I have low suspicion for ACS, PE, TAD, pneumothorax, carditis, mediastinitis, pneumonia, CHF, or sepsis. ? ?Recommend follow-up with PCP. ? [PS]  ?  ?Clinical Course User Index ?[PS] Carrie Mew, MD  ? ? ? ?FINAL CLINICAL IMPRESSION(S) / ED DIAGNOSES  ? ?Final diagnoses:  ?Nonspecific chest pain  ? ? ? ?Rx / DC Orders  ? ?ED Discharge Orders   ? ? None  ? ?  ? ? ? ?Note:  This document was prepared using Dragon voice recognition software and may include unintentional dictation errors. ?   ?Carrie Mew, MD ?07/14/21 [phone] ? ?

## 2021-07-14 NOTE — ED Triage Notes (Signed)
Pt c/o L sided CP that started approx 1 week ago. States that she felt like when she is going up stairs it gets worse. Pt is A&OX4 and NAD ?

## 2021-07-15 ENCOUNTER — Ambulatory Visit (INDEPENDENT_AMBULATORY_CARE_PROVIDER_SITE_OTHER): Payer: BC Managed Care – PPO | Admitting: Dermatology

## 2021-07-15 VITALS — Wt 200.0 lb

## 2021-07-15 DIAGNOSIS — Z79899 Other long term (current) drug therapy: Secondary | ICD-10-CM

## 2021-07-15 DIAGNOSIS — L309 Dermatitis, unspecified: Secondary | ICD-10-CM

## 2021-07-15 DIAGNOSIS — L853 Xerosis cutis: Secondary | ICD-10-CM

## 2021-07-15 DIAGNOSIS — K130 Diseases of lips: Secondary | ICD-10-CM | POA: Diagnosis not present

## 2021-07-15 DIAGNOSIS — L700 Acne vulgaris: Secondary | ICD-10-CM | POA: Diagnosis not present

## 2021-07-15 MED ORDER — ISOTRETINOIN 40 MG PO CAPS: 40.0000 mg | ORAL_CAPSULE | Freq: Every day | ORAL | 0 refills | Status: DC

## 2021-07-15 NOTE — Patient Instructions (Signed)
Reviewed potential side effects of isotretinoin including xerosis, cheilitis, hepatitis, hyperlipidemia, and severe birth defects if taken by a pregnant woman. Reviewed reports of suicidal ideation in those with a history of depression while taking isotretinoin and reports of diagnosis of inflammatory bowl disease while taking isotretinoin as well as the lack of evidence for a causal relationship between isotretinoin, depression and IBD. Patient advised to reach out with any questions or concerns. Patient advised not to share pills or donate blood while on treatment or for one month after completing treatment.  ? ? ?If You Need Anything After Your Visit ? ?If you have any questions or concerns for your doctor, please call our main line at [phone] and press option 4 to reach your doctor's medical assistant. If no one answers, please leave a voicemail as directed and we will return your call as soon as possible. Messages left after 4 pm will be answered the following business day.  ? ?You may also send us a message via MyChart. We typically respond to MyChart messages within 1-2 business days. ? ?For prescription refills, please ask your pharmacy to contact our office. Our fax number is [phone]. ? ?If you have an urgent issue when the clinic is closed that cannot wait until the next business day, you can page your doctor at the number below.   ? ?Please note that while we do our best to be available for urgent issues outside of office hours, we are not available 24/7.  ? ?If you have an urgent issue and are unable to reach us, you may choose to seek medical care at your doctor's office, retail clinic, urgent care center, or emergency room. ? ?If you have a medical emergency, please immediately call 911 or go to the emergency department. ? ?Pager Numbers ? ?- Dr. Kowalski: [phone] ? ?- Dr. Moye: [phone] ? ?- Dr. Stewart: [phone] ? ?In the event of inclement weather, please call our main line at  [phone] for an update on the status of any delays or closures. ? ?Dermatology Medication Tips: ?Please keep the boxes that topical medications come in in order to help keep track of the instructions about where and how to use these. Pharmacies typically print the medication instructions only on the boxes and not directly on the medication tubes.  ? ?If your medication is too expensive, please contact our office at [phone] option 4 or send us a message through MyChart.  ? ?We are unable to tell what your co-pay for medications will be in advance as this is different depending on your insurance coverage. However, we may be able to find a substitute medication at lower cost or fill out paperwork to get insurance to cover a needed medication.  ? ?If a prior authorization is required to get your medication covered by your insurance company, please allow us 1-2 business days to complete this process. ? ?Drug prices often vary depending on where the prescription is filled and some pharmacies may offer cheaper prices. ? ?The website www.goodrx.com contains coupons for medications through different pharmacies. The prices here do not account for what the cost may be with help from insurance (it may be cheaper with your insurance), but the website can give you the price if you did not use any insurance.  ?- You can print the associated coupon and take it with your prescription to the pharmacy.  ?- You may also stop by our office during regular business hours and pick up a GoodRx coupon card.  ?-   If you need your prescription sent electronically to a different pharmacy, notify our office through [facility] MyChart or by phone at [phone] option 4. ? ? ? ? ?Si Usted Necesita Algo Despu?s de Su Visita ? ?Tambi?n puede enviarnos un mensaje a trav?s de MyChart. Por lo general respondemos a los mensajes de MyChart en el transcurso de 1 a 2 d?as h?biles. ? ?Para renovar recetas, por favor pida a su farmacia que se  ponga en contacto con nuestra oficina. Nuestro n?mero de fax es el [phone]. ? ?Si tiene un asunto urgente cuando la cl?nica est? cerrada y que no puede esperar hasta el siguiente d?a h?bil, puede llamar/localizar a su doctor(a) al n?mero que aparece a continuaci?n.  ? ?Por favor, tenga en cuenta que aunque hacemos todo lo posible para estar disponibles para asuntos urgentes fuera del horario de oficina, no estamos disponibles las 24 horas del d?a, los 7 d?as de la semana.  ? ?Si tiene un problema urgente y no puede comunicarse con nosotros, puede optar por buscar atenci?n m?dica  en el consultorio de su doctor(a), en una cl?nica privada, en un centro de atenci?n urgente o en una sala de emergencias. ? ?Si tiene una emergencia m?dica, por favor llame inmediatamente al 911 o vaya a la sala de emergencias. ? ?N?meros de b?per ? ?- Dr. Kowalski: [phone] ? ?- Dra. Moye: [phone] ? ?- Dra. Stewart: [phone] ? ?En caso de inclemencias del tiempo, por favor llame a nuestra l?nea principal al [phone] para una actualizaci?n sobre el estado de cualquier retraso o cierre. ? ?Consejos para la medicaci?n en dermatolog?a: ?Por favor, guarde las cajas en las que vienen los medicamentos de uso t?pico para ayudarle a seguir las instrucciones sobre d?nde y c?mo usarlos. Las farmacias generalmente imprimen las instrucciones del medicamento s?lo en las cajas y no directamente en los tubos del medicamento.  ? ?Si su medicamento es muy caro, por favor, p?ngase en contacto con nuestra oficina llamando al [phone] y presione la opci?n 4 o env?enos un mensaje a trav?s de MyChart.  ? ?No podemos decirle cu?l ser? su copago por los medicamentos por adelantado ya que esto es diferente dependiendo de la cobertura de su seguro. Sin embargo, es posible que podamos encontrar un medicamento sustituto a menor costo o llenar un formulario para que el seguro cubra el medicamento que se considera necesario.  ? ?Si se requiere  una autorizaci?n previa para que su compa??a de seguros cubra su medicamento, por favor perm?tanos de 1 a 2 d?as h?biles para completar este proceso. ? ?Los precios de los medicamentos var?an con frecuencia dependiendo del lugar de d?nde se surte la receta y alguna farmacias pueden ofrecer precios m?s baratos. ? ?El sitio web www.goodrx.com tiene cupones para medicamentos de diferentes farmacias. Los precios aqu? no tienen en cuenta lo que podr?a costar con la ayuda del seguro (puede ser m?s barato con su seguro), pero el sitio web puede darle el precio si no utiliz? ning?n seguro.  ?- Puede imprimir el cup?n correspondiente y llevarlo con su receta a la farmacia.  ?- Tambi?n puede pasar por nuestra oficina durante el horario de atenci?n regular y recoger una tarjeta de cupones de GoodRx.  ?- Si necesita que su receta se env?e electr?nicamente a una farmacia diferente, informe a nuestra oficina a trav?s de MyChart de [place] o por tel?fono llamando al [phone] y presione la opci?n 4.  ?

## 2021-07-15 NOTE — Progress Notes (Addendum)
? ?Isotretinoin Follow-Up Visit ?  ?Subjective  ?Nicole Chambers is a 31 y.o. female who presents for the following: No chief complaint on file.. ? ?Week # 28 ? ? Isotretinoin F/U - 07/15/21 1400   ? ?  ? Isotretinoin Follow Up  ? iPledge # [id number]   ? Date 07/15/21   ? Weight 200 lb (90.7 kg)   ? Two Forms of Birth Control Female Condom;Oral Contraceptives (w/ estrogen)   ? Acne breakouts since last visit? No   ?  ? Dosage  ? Target Dosage (mg) 13605   ? Current (To Date) Dosage (mg) 6600   ? To Go Dosage (mg) 7005   ?  ? Side Effects  ? Skin Chapped Lips;Dry Skin   ? Gastrointestinal WNL   ? Neurological WNL   ? Constitutional WNL   ? ?  ?  ? ?  ? ? ?Side effects: Dry skin, dry lips ? ?Denies changes in night vision, shortness of breath, abdominal pain, nausea, vomiting, diarrhea, blood in stool or urine, visual changes, headaches, epistaxis, joint pain, myalgias, mood changes, depression, or suicidal ideation.  ? ?Patient is not pregnant, not seeking pregnancy, and not breastfeeding. ? ? ?The following portions of the chart were reviewed this encounter and updated as appropriate: medications, allergies, medical history ? ?Review of Systems:  No other skin or systemic complaints except as noted in HPI or Assessment and Plan. ? ?Objective  ?Well appearing patient in no apparent distress; mood and affect are within normal limits. ? ?An examination of the face, neck, chest, and back was performed and relevant findings are noted below.  ? ?face ?Face clear ? ?wrists, elbow ?Skin clear today. ? ? ? ?Assessment & Plan  ? ?Acne vulgaris ?face ? ?Acne is Severe; chronic and persistent; not at goal. ?Patient is on Isotretinoin -  requiring FDA mandated monthly evaluations and laboratory monitoring.  Patient's depression stable on current dose.  ?  ?Week # 28 ?Oral BC pills, condoms ?iPledge # [id number] ?[facility] ?Total to date - 6600 mg ?Total mg/kg - 72.77 mg/kg ?  ?1/23 labs reviewed ?Continue isotretinoin 40 mg  once daily.  ?  ?Urine pregnancy test performed in office today and was negative.  Patient demonstrates comprehension and confirms she will not get pregnant.  ?  ?Patient confirmed in [place] and isotretinoin sent to pharmacy. ?  ? ?Dermatitis ?wrists, elbow ? ?Secondary to isotretinoin treatment- improved ? ?Continue TMC 0.1% Cream qd/bid prn flares. Avoid face, groin, axilla.  ? ?Recommend mild soap and moisturizing cream 1-2 times daily.   ? ?Topical steroids (such as triamcinolone, fluocinolone, fluocinonide, mometasone, clobetasol, halobetasol, betamethasone, hydrocortisone) can cause thinning and lightening of the skin if they are used for too [place] in the same area. Your physician has selected the right strength medicine for your problem and area affected on the body. Please use your medication only as directed by your physician to prevent side effects.  ? ? ? ? ?Xerosis secondary to isotretinoin therapy ?- Continue emollients as directed ? ?Cheilitis secondary to isotretinoin therapy ?- Continue lip balm as directed, Dr. Luvenia Heller Cortibalm recommended ? ?[place] term medication management (isotretinoin) ?- While taking Isotretinoin and for 30 days after you finish the medication, do not get pregnant, do not share pills, do not donate blood. Isotretinoin is best absorbed when taken with a fatty meal. Isotretinoin can make you sensitive to the sun. Daily careful sun protection including sunscreen SPF 30+ when outdoors is recommended. ? ?Follow-up  in 30 days. ? ?I, Jamesetta Orleans, CMA, am acting as scribe for Brendolyn Patty, MD . ?Documentation: I have reviewed the above documentation for accuracy and completeness, and I agree with the above. ? ?Brendolyn Patty MD  ?

## 2021-07-21 ENCOUNTER — Encounter: Payer: Self-pay | Admitting: Dermatology

## 2021-08-19 ENCOUNTER — Telehealth: Payer: Self-pay

## 2021-08-19 ENCOUNTER — Ambulatory Visit (INDEPENDENT_AMBULATORY_CARE_PROVIDER_SITE_OTHER): Payer: BC Managed Care – PPO | Admitting: Dermatology

## 2021-08-19 DIAGNOSIS — L700 Acne vulgaris: Secondary | ICD-10-CM

## 2021-08-19 MED ORDER — ISOTRETINOIN 40 MG PO CAPS
40.0000 mg | ORAL_CAPSULE | Freq: Every day | ORAL | 0 refills | Status: DC
Start: 2021-08-19 — End: 2021-09-24

## 2021-08-19 NOTE — Telephone Encounter (Signed)
Patient did not get isotretinoin prescription filled last month because she waited to answer questions and got locked out. Patient came in office today for urine pregnancy test. Urine pregnancy test performed in office today and was negative.  Patient demonstrates comprehension and confirms she will not get pregnant. Patient confirmed in iPledge and isotretinoin sent to pharmacy. ?Butch Penny., RMA ?

## 2021-08-22 NOTE — Progress Notes (Signed)
Appointment canceled.

## 2021-09-02 ENCOUNTER — Other Ambulatory Visit: Payer: Self-pay

## 2021-09-02 MED ORDER — OZEMPIC (0.25 OR 0.5 MG/DOSE) 2 MG/3ML ~~LOC~~ SOPN
PEN_INJECTOR | SUBCUTANEOUS | 3 refills | Status: DC
  Filled 2021-09-02: qty 3, 56d supply, fill #0
  Filled 2021-10-20: qty 3, 56d supply, fill #1
  Filled 2021-11-18 – 2021-12-01 (×3): qty 3, 56d supply, fill #2
  Filled 2022-01-25 – 2022-02-03 (×3): qty 3, 56d supply, fill #3

## 2021-09-22 ENCOUNTER — Ambulatory Visit (INDEPENDENT_AMBULATORY_CARE_PROVIDER_SITE_OTHER): Payer: BC Managed Care – PPO | Admitting: Dermatology

## 2021-09-22 VITALS — Wt 200.0 lb

## 2021-09-22 DIAGNOSIS — K130 Diseases of lips: Secondary | ICD-10-CM

## 2021-09-22 DIAGNOSIS — L219 Seborrheic dermatitis, unspecified: Secondary | ICD-10-CM | POA: Diagnosis not present

## 2021-09-22 DIAGNOSIS — L309 Dermatitis, unspecified: Secondary | ICD-10-CM

## 2021-09-22 DIAGNOSIS — Z79899 Other long term (current) drug therapy: Secondary | ICD-10-CM

## 2021-09-22 DIAGNOSIS — L700 Acne vulgaris: Secondary | ICD-10-CM | POA: Diagnosis not present

## 2021-09-22 DIAGNOSIS — L853 Xerosis cutis: Secondary | ICD-10-CM

## 2021-09-22 MED ORDER — KETOCONAZOLE 2 % EX SHAM: 1.0000 "application " | MEDICATED_SHAMPOO | CUTANEOUS | 6 refills | Status: DC

## 2021-09-22 NOTE — Progress Notes (Signed)
Isotretinoin Follow-Up Visit   Subjective  Nicole Chambers is a 31 y.o. female who presents for the following: Acne (Face, wk 32 isotretinoin, Isotretinoin 40mg  1 po qd).  Week # 32   Isotretinoin F/U - 09/22/21 1300       Isotretinoin Follow Up   iPledge # 11/22/21    Date 09/22/21    Weight 200 lb (90.7 kg)    Two Forms of Birth Control Oral Contraceptives (w/ estrogen);Female Condom    Acne breakouts since last visit? No      Dosage   Target Dosage (mg) 13605    Current (To Date) Dosage (mg) 7800    To Go Dosage (mg) 5805      Side Effects   Skin Chapped Lips;Dry Eyes;Dry Lips;Dry Nose;Dry Skin    Gastrointestinal WNL    Neurological WNL    Constitutional WNL             Side effects: Dry skin, dry lips  Denies changes in night vision, shortness of breath, abdominal pain, nausea, vomiting, diarrhea, blood in stool or urine, visual changes, headaches, epistaxis, joint pain, myalgias, mood changes, depression, or suicidal ideation.   Patient is not pregnant, not seeking pregnancy, and not breastfeeding.   The following portions of the chart were reviewed this encounter and updated as appropriate: medications, allergies, medical history  Review of Systems:  No other skin or systemic complaints except as noted in HPI or Assessment and Plan.  Objective  Well appearing patient in no apparent distress; mood and affect are within normal limits.  An examination of the face, neck, chest, and back was performed and relevant findings are noted below.   Head - Anterior (Face) Hyperpigmented macules cheeks, otherwise clear  hands, arms Pink scaly patches R hand dorsum, R elbow  Scalp Greasy scaling    Assessment & Plan   Acne vulgaris Head - Anterior (Face)  Acne is severe and chronic (present >1 year); patient is currently on Isotretinoin, requiring FDA mandated monthly evaluations and laboratory monitoring, and not to goal (must reach target dose based on  weight and also have clear skin for 2 months prior to discontinuation in order to help prevent relapse)    Week # 32 Oral BC pills, condoms iPledge # 11/22/21 [name] Total to date - 7,800 mg Total mg/kg - 86 mg/kg  Pending labs, cont Isotretinoin 40mg  1 po qd with fatty meal  Related Procedures Comprehensive metabolic panel Lipid panel hCG, serum, qualitative  Eczema, unspecified type hands, arms  Secondary to isotretinoin  Cont TMC 0.1% cr qd/bid until clear, then prn flares, avoid f/g/a  Recommend mild soap and moisturizing cream 1-2 times daily.  Gentle skin care handout provided.    Topical steroids (such as triamcinolone, fluocinolone, fluocinonide, mometasone, clobetasol, halobetasol, betamethasone, hydrocortisone) can cause thinning and lightening of the skin if they are used for too Kleier in the same area. Your physician has selected the right strength medicine for your problem and area affected on the body. Please use your medication only as directed by your physician to prevent side effects.    Seborrheic dermatitis Scalp  Chronic and persistent condition with duration or expected duration over one year. Condition is bothersome/symptomatic for patient. Currently flared.   Seborrheic Dermatitis  -  is a chronic persistent rash characterized by pinkness and scaling most commonly of the mid face but also can occur on the scalp (dandruff), ears; mid chest, mid back and groin.  It tends to be  exacerbated by stress and cooler weather.  People who have neurologic disease may experience new onset or exacerbation of existing seborrheic dermatitis.  The condition is not curable but treatable and can be controlled.  Restart Ketoconazole 2% shampoo 1-2x/wk to wash scalp, let sit 5 minutes and rinse out.  ketoconazole (NIZORAL) 2 % shampoo - Scalp Apply 1 application  topically 2 (two) times a week. Wash scalp 2 times weekly, let sit 5 minutes and rinse  out    Xerosis secondary to isotretinoin therapy - Continue emollients as directed  Cheilitis secondary to isotretinoin therapy - Continue lip balm as directed, Dr. Clayborne Artist Cortibalm recommended  [place] term medication management (isotretinoin) - While taking Isotretinoin and for 30 days after you finish the medication, do not get pregnant, do not share pills, do not donate blood. Isotretinoin is best absorbed when taken with a fatty meal. Isotretinoin can make you sensitive to the sun. Daily careful sun protection including sunscreen SPF 30+ when outdoors is recommended.  Follow-up in 30 days.  I, Ardis Rowan, RMA, am acting as scribe for Willeen Niece, MD .  Documentation: I have reviewed the above documentation for accuracy and completeness, and I agree with the above.  Willeen Niece MD

## 2021-09-23 DIAGNOSIS — L700 Acne vulgaris: Secondary | ICD-10-CM | POA: Diagnosis not present

## 2021-09-24 ENCOUNTER — Telehealth: Payer: Self-pay

## 2021-09-24 ENCOUNTER — Other Ambulatory Visit: Payer: Self-pay

## 2021-09-24 LAB — LIPID PANEL
Chol/HDL Ratio: 3.3 ratio (ref 0.0–4.4)
Cholesterol, Total: 200 mg/dL — ABNORMAL HIGH (ref 100–199)
HDL: 60 mg/dL (ref >39)
LDL Chol Calc (NIH): 105 mg/dL — ABNORMAL HIGH (ref 0–99)
Triglycerides: 203 mg/dL — ABNORMAL HIGH (ref 0–149)
VLDL Cholesterol Cal: 35 mg/dL (ref 5–40)

## 2021-09-24 LAB — COMPREHENSIVE METABOLIC PANEL
ALT: 25 IU/L (ref 0–32)
AST: 41 IU/L — ABNORMAL HIGH (ref 0–40)
Albumin/Globulin Ratio: 1.5 (ref 1.2–2.2)
Albumin: 4.1 g/dL (ref 3.9–5.0)
Alkaline Phosphatase: 69 IU/L (ref 44–121)
BUN/Creatinine Ratio: 17 (ref 9–23)
BUN: 11 mg/dL (ref 6–20)
Bilirubin Total: <0.2 mg/dL (ref 0.0–1.2)
CO2: 21 mmol/L (ref 20–29)
Calcium: 9.1 mg/dL (ref 8.7–10.2)
Chloride: 101 mmol/L (ref 96–106)
Creatinine, Ser: 0.64 mg/dL (ref 0.57–1.00)
Globulin, Total: 2.7 g/dL (ref 1.5–4.5)
Glucose: 228 mg/dL — ABNORMAL HIGH (ref 70–99)
Potassium: 4 mmol/L (ref 3.5–5.2)
Sodium: 137 mmol/L (ref 134–144)
Total Protein: 6.8 g/dL (ref 6.0–8.5)
eGFR: 123 mL/min/{1.73_m2} (ref >59)

## 2021-09-24 LAB — HCG, SERUM, QUALITATIVE: hCG,Beta Subunit,Qual,Serum: NEGATIVE m[IU]/mL (ref <6)

## 2021-09-24 MED ORDER — ISOTRETINOIN 40 MG PO CAPS: 40.0000 mg | ORAL_CAPSULE | Freq: Every day | ORAL | 0 refills | Status: DC

## 2021-09-24 NOTE — Telephone Encounter (Signed)
-----   Message from Tara Stewart, MD sent at 09/24/2021 10:17 AM EDT ----- Labs ok, Glucose elevated at 228, Pt is diabetic, Pregnancy neg Send in Absorica 40 mg PO qd, no rfs   - please call patient 

## 2021-09-24 NOTE — Telephone Encounter (Signed)
Tried to call patient with lab results. No answer and not able to leave message. Patient confirmed in iPLEDGE program and Absorica 40mg  1 po QD with food dsp #[id number] sent to [facility].

## 2021-09-25 ENCOUNTER — Telehealth: Payer: Self-pay

## 2021-09-25 NOTE — Telephone Encounter (Signed)
-----   Message from Willeen Niece, MD sent at 09/24/2021 10:17 AM EDT ----- Labs ok, Glucose elevated at 228, Pt is diabetic, Pregnancy neg Send in Absorica 40 mg PO qd, no rfs   - please call patient

## 2021-09-25 NOTE — Telephone Encounter (Signed)
Tried to call pt about labs and no answer or vm.  Pt was confimed in IPLEDGE program 09/24/21 and Isotretinoin was sent to [facility]

## 2021-09-30 NOTE — Telephone Encounter (Signed)
Tried to call pt about labs for Isotretinoin, no answer and no vm.  Sent pt an email to call the office for lab results./sh

## 2021-10-20 ENCOUNTER — Other Ambulatory Visit: Payer: Self-pay

## 2021-10-21 ENCOUNTER — Ambulatory Visit (INDEPENDENT_AMBULATORY_CARE_PROVIDER_SITE_OTHER): Payer: BC Managed Care – PPO | Admitting: Dermatology

## 2021-10-21 VITALS — Wt 200.0 lb

## 2021-10-21 DIAGNOSIS — Z79899 Other long term (current) drug therapy: Secondary | ICD-10-CM | POA: Diagnosis not present

## 2021-10-21 DIAGNOSIS — K130 Diseases of lips: Secondary | ICD-10-CM

## 2021-10-21 DIAGNOSIS — L700 Acne vulgaris: Secondary | ICD-10-CM | POA: Diagnosis not present

## 2021-10-21 DIAGNOSIS — L853 Xerosis cutis: Secondary | ICD-10-CM

## 2021-10-21 NOTE — Patient Instructions (Signed)
Due to recent changes in healthcare laws, you may see results of your pathology and/or laboratory studies on MyChart before the doctors have had a chance to review them. We understand that in some cases there may be results that are confusing or concerning to you. Please understand that not all results are received at the same time and often the doctors may need to interpret multiple results in order to provide you with the best plan of care or course of treatment. Therefore, we ask that you please give us 2 business days to thoroughly review all your results before contacting the office for clarification. Should we see a critical lab result, you will be contacted sooner.   If You Need Anything After Your Visit  If you have any questions or concerns for your doctor, please call our main line at [phone] and press option 4 to reach your doctor's medical assistant. If no one answers, please leave a voicemail as directed and we will return your call as soon as possible. Messages left after 4 pm will be answered the following business day.   You may also send us a message via MyChart. We typically respond to MyChart messages within 1-2 business days.  For prescription refills, please ask your pharmacy to contact our office. Our fax number is [phone].  If you have an urgent issue when the clinic is closed that cannot wait until the next business day, you can page your doctor at the number below.    Please note that while we do our best to be available for urgent issues outside of office hours, we are not available 24/7.   If you have an urgent issue and are unable to reach us, you may choose to seek medical care at your doctor's office, retail clinic, urgent care center, or emergency room.  If you have a medical emergency, please immediately call 911 or go to the emergency department.  Pager Numbers  - Dr. Kowalski: [phone]  - Dr. Moye: [phone]  - Dr. Stewart:  [phone]  In the event of inclement weather, please call our main line at [phone] for an update on the status of any delays or closures.  Dermatology Medication Tips: Please keep the boxes that topical medications come in in order to help keep track of the instructions about where and how to use these. Pharmacies typically print the medication instructions only on the boxes and not directly on the medication tubes.   If your medication is too expensive, please contact our office at [phone] option 4 or send us a message through MyChart.   We are unable to tell what your co-pay for medications will be in advance as this is different depending on your insurance coverage. However, we may be able to find a substitute medication at lower cost or fill out paperwork to get insurance to cover a needed medication.   If a prior authorization is required to get your medication covered by your insurance company, please allow us 1-2 business days to complete this process.  Drug prices often vary depending on where the prescription is filled and some pharmacies may offer cheaper prices.  The website www.goodrx.com contains coupons for medications through different pharmacies. The prices here do not account for what the cost may be with help from insurance (it may be cheaper with your insurance), but the website can give you the price if you did not use any insurance.  - You can print the associated coupon and take it with   your prescription to the pharmacy.  - You may also stop by our office during regular business hours and pick up a GoodRx coupon card.  - If you need your prescription sent electronically to a different pharmacy, notify our office through [facility] MyChart or by phone at [phone] option 4.     Si Usted Necesita Algo Despus de Su Visita  Tambin puede enviarnos un mensaje a travs de MyChart. Por lo general respondemos a los mensajes de MyChart en el transcurso de 1 a 2  das hbiles.  Para renovar recetas, por favor pida a su farmacia que se ponga en contacto con nuestra oficina. Nuestro nmero de fax es el [phone].  Si tiene un asunto urgente cuando la clnica est cerrada y que no puede esperar hasta el siguiente da hbil, puede llamar/localizar a su doctor(a) al nmero que aparece a continuacin.   Por favor, tenga en cuenta que aunque hacemos todo lo posible para estar disponibles para asuntos urgentes fuera del horario de oficina, no estamos disponibles las 24 horas del da, los 7 das de la semana.   Si tiene un problema urgente y no puede comunicarse con nosotros, puede optar por buscar atencin mdica  en el consultorio de su doctor(a), en una clnica privada, en un centro de atencin urgente o en una sala de emergencias.  Si tiene una emergencia mdica, por favor llame inmediatamente al 911 o vaya a la sala de emergencias.  Nmeros de bper  - Dr. Kowalski: [phone]  - Dra. Moye: [phone]  - Dra. Stewart: [phone]  En caso de inclemencias del tiempo, por favor llame a nuestra lnea principal al [phone] para una actualizacin sobre el estado de cualquier retraso o cierre.  Consejos para la medicacin en dermatologa: Por favor, guarde las cajas en las que vienen los medicamentos de uso tpico para ayudarle a seguir las instrucciones sobre dnde y cmo usarlos. Las farmacias generalmente imprimen las instrucciones del medicamento slo en las cajas y no directamente en los tubos del medicamento.   Si su medicamento es muy caro, por favor, pngase en contacto con nuestra oficina llamando al [phone] y presione la opcin 4 o envenos un mensaje a travs de MyChart.   No podemos decirle cul ser su copago por los medicamentos por adelantado ya que esto es diferente dependiendo de la cobertura de su seguro. Sin embargo, es posible que podamos encontrar un medicamento sustituto a menor costo o llenar un formulario para que el  seguro cubra el medicamento que se considera necesario.   Si se requiere una autorizacin previa para que su compaa de seguros cubra su medicamento, por favor permtanos de 1 a 2 das hbiles para completar este proceso.  Los precios de los medicamentos varan con frecuencia dependiendo del lugar de dnde se surte la receta y alguna farmacias pueden ofrecer precios ms baratos.  El sitio web www.goodrx.com tiene cupones para medicamentos de diferentes farmacias. Los precios aqu no tienen en cuenta lo que podra costar con la ayuda del seguro (puede ser ms barato con su seguro), pero el sitio web puede darle el precio si no utiliz ningn seguro.  - Puede imprimir el cupn correspondiente y llevarlo con su receta a la farmacia.  - Tambin puede pasar por nuestra oficina durante el horario de atencin regular y recoger una tarjeta de cupones de GoodRx.  - Si necesita que su receta se enve electrnicamente a una farmacia diferente, informe a nuestra oficina a travs de MyChart de [facility]   o por telfono llamando al [phone] y presione la opcin 4.  

## 2021-10-21 NOTE — Progress Notes (Signed)
   Isotretinoin Follow-Up Visit   Subjective  Nicole Chambers is a 31 y.o. female who presents for the following: Follow-up (Patient here today for 30 day accutane follow up. Patient currently taking isotretinoin 40 mg daily. ).  She has had clear skin for several months and would like to stop medication.  Week # 36  Oral BC pills, condoms iPledge # [id number] [name] Total to date - 9,000 mg Total mg/kg - 99.22 mg/kg   Isotretinoin F/U - 10/21/21 1400       Isotretinoin Follow Up   iPledge # [id number]    Date 10/21/21    Weight 200 lb (90.7 kg)    Two Forms of Birth Control Oral Contraceptives (w/ estrogen);Female Condom    Acne breakouts since last visit? No      Dosage   Target Dosage (mg) 13605    Current (To Date) Dosage (mg) 9000    To Go Dosage (mg) 4605             Side effects: Dry skin, dry lips  Denies changes in night vision, shortness of breath, abdominal pain, nausea, vomiting, diarrhea, blood in stool or urine, visual changes, headaches, epistaxis, joint pain, myalgias, mood changes, depression, or suicidal ideation.   Patient is not pregnant, not seeking pregnancy, and not breastfeeding.   The following portions of the chart were reviewed this encounter and updated as appropriate: medications, allergies, medical history  Review of Systems:  No other skin or systemic complaints except as noted in HPI or Assessment and Plan.  Objective  Well appearing patient in no apparent distress; mood and affect are within normal limits.  An examination of the face, neck, chest, and back was performed and relevant findings are noted below.   face Clear     Assessment & Plan   Acne vulgaris face  Week #36 isotretinoin, Pt has reached 100 mg/kg and has clear skin for several months- good result.  Patient would like to discontinue isotretinoin today. Discussed possible risk of relapse since below the 150 mg/kg threshold.  Will monitor.  Last dose taken  this morning. Urine pregnancy test performed in office today and was negative.    Patient advised she will need to do pregnancy test in 1 month.   Related Procedures Pregnancy, urine    Xerosis secondary to isotretinoin therapy - Continue emollients as directed  Cheilitis secondary to isotretinoin therapy - Continue lip balm as directed, Dr. Clayborne Artist Cortibalm recommended  [place] term medication management (isotretinoin) - While taking Isotretinoin and for 30 days after you finish the medication, do not get pregnant, do not share pills, do not donate blood. Isotretinoin is best absorbed when taken with a fatty meal. Isotretinoin can make you sensitive to the sun. Daily careful sun protection including sunscreen SPF 30+ when outdoors is recommended.  Follow-up in 30 days.  Anise Salvo, RMA, am acting as scribe for Willeen Niece, MD .  Documentation: I have reviewed the above documentation for accuracy and completeness, and I agree with the above.  Willeen Niece MD

## 2021-11-11 ENCOUNTER — Encounter: Payer: Self-pay | Admitting: Nurse Practitioner

## 2021-11-18 ENCOUNTER — Other Ambulatory Visit: Payer: Self-pay

## 2021-11-19 ENCOUNTER — Other Ambulatory Visit: Payer: Self-pay | Admitting: Advanced Practice Midwife

## 2021-11-19 DIAGNOSIS — N920 Excessive and frequent menstruation with regular cycle: Secondary | ICD-10-CM

## 2021-11-24 ENCOUNTER — Other Ambulatory Visit: Payer: Self-pay

## 2021-11-25 ENCOUNTER — Encounter: Payer: Self-pay | Admitting: Nurse Practitioner

## 2021-11-25 ENCOUNTER — Ambulatory Visit (INDEPENDENT_AMBULATORY_CARE_PROVIDER_SITE_OTHER): Payer: BC Managed Care – PPO | Admitting: Nurse Practitioner

## 2021-11-25 ENCOUNTER — Other Ambulatory Visit: Payer: Self-pay

## 2021-11-25 VITALS — BP 110/68 | HR 98 | Temp 98.3°F | Resp 18 | Ht 72.0 in | Wt 205.0 lb

## 2021-11-25 DIAGNOSIS — Z020 Encounter for examination for admission to educational institution: Secondary | ICD-10-CM

## 2021-11-25 DIAGNOSIS — Z111 Encounter for screening for respiratory tuberculosis: Secondary | ICD-10-CM | POA: Diagnosis not present

## 2021-11-25 DIAGNOSIS — Z2839 Other underimmunization status: Secondary | ICD-10-CM | POA: Diagnosis not present

## 2021-11-25 DIAGNOSIS — Z0184 Encounter for antibody response examination: Secondary | ICD-10-CM | POA: Diagnosis not present

## 2021-11-25 NOTE — Progress Notes (Signed)
BP 110/68   Pulse 98   Temp 98.3 F (36.8 C) (Oral)   Resp 18   Ht 6' (1.829 m)   Wt 205 lb (93 kg)   SpO2 99%   BMI 27.80 kg/m    Subjective:    Patient ID: Nicole Chambers, female    DOB: 1990/09/29, 29 y.o.   MRN: [id number]  HPI: Nicole Chambers is a 31 y.o. 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  Immunization record not complete will get titers.  She also needs to get covid series for school.  She is up to date on Td will get titers to evaluate other needs.   Relevant past medical, surgical, family and social history reviewed and updated as indicated. Interim medical history since our last visit reviewed. Allergies and medications reviewed and updated.  Review of Systems  Constitutional: Negative for fever or weight change.  Respiratory: Negative for cough and shortness of breath.   Cardiovascular: Negative for chest pain or palpitations.  Gastrointestinal: Negative for abdominal pain, no bowel changes.  Musculoskeletal: Negative for gait problem or joint swelling.  Skin: Negative for rash.  Neurological: Negative for dizziness or headache.  No other specific complaints in a complete review of systems (except as listed in HPI above).      Objective:    BP 110/68   Pulse 98   Temp 98.3 F (36.8 C) (Oral)   Resp 18   Ht 6' (1.829 m)   Wt 205 lb (93 kg)   SpO2 99%   BMI 27.80 kg/m   Wt Readings from Last 3 Encounters:  11/25/21 205 lb (93 kg)  10/21/21 200 lb (90.7 kg)  09/22/21 200 lb (90.7 kg)    Physical Exam  Constitutional: Patient appears well-developed and well-nourished. Obese  No distress.  HEENT: head atraumatic, normocephalic, pupils equal and reactive to light,  neck supple Cardiovascular: Normal rate, regular rhythm and normal heart sounds.  No murmur heard. No BLE edema. Pulmonary/Chest: Effort normal and breath sounds normal.  No respiratory distress. Abdominal: Soft.  There is no tenderness. Psychiatric: Patient has a normal mood and affect. behavior is normal. Judgment and thought content normal.  Results for orders placed or performed in visit on 09/22/21  Comprehensive metabolic panel  Result Value Ref Range   Glucose 228 (H) 70 - 99 mg/dL   BUN 11 6 - 20 mg/dL   Creatinine, Ser 0.64 0.57 - 1.00 mg/dL   eGFR 123 >59 mL/min/1.73   BUN/Creatinine Ratio 17 9 - 23   Sodium 137 134 - 144 mmol/L   Potassium 4.0 3.5 - 5.2 mmol/L   Chloride 101 96 - 106 mmol/L   CO2 21 20 - 29 mmol/L   Calcium 9.1 8.7 - 10.2 mg/dL   Total Protein 6.8 6.0 - 8.5 g/dL   Albumin 4.1 3.9 - 5.0 g/dL   Globulin, Total 2.7 1.5 - 4.5 g/dL   Albumin/Globulin Ratio 1.5 1.2 - 2.2   Bilirubin Total <0.2 0.0 - 1.2 mg/dL   Alkaline Phosphatase 69 44 - 121 IU/L   AST 41 (H) 0 - 40 IU/L   ALT 25 0 - 32 IU/L  Lipid panel  Result Value Ref Range   Cholesterol, Total 200 (H) 100 - 199 mg/dL   Triglycerides 203 (H) 0 - 149 mg/dL   HDL 60 >39 mg/dL   VLDL Cholesterol  Cal 35 5 - 40 mg/dL   LDL Chol Calc (NIH) 105 (H) 0 - 99 mg/dL   Chol/HDL Ratio 3.3 0.0 - 4.4 ratio  hCG, serum, qualitative  Result Value Ref Range   hCG,Beta Subunit,Qual,Serum Negative Negative <6 mIU/mL      Assessment & Plan:   Problem List Items Addressed This Visit   None Visit Diagnoses     Immunizations incomplete    -  Primary   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  Patient's immunization record is incomplete we will get titers.   Relevant Orders   Varicella zoster antibody, IgG   Measles/Mumps/Rubella Immunity   QuantiFERON-TB Gold Plus        Follow up plan: Return if symptoms worsen or fail to improve.

## 2021-11-28 ENCOUNTER — Ambulatory Visit: Payer: BC Managed Care – PPO

## 2021-11-29 LAB — QUANTIFERON-TB GOLD PLUS
Mitogen-NIL: 7.15 IU/mL
NIL: 0.02 IU/mL
QuantiFERON-TB Gold Plus: NEGATIVE
TB1-NIL: 0.01 IU/mL
TB2-NIL: <0 IU/mL

## 2021-11-29 LAB — MEASLES/MUMPS/RUBELLA IMMUNITY
Mumps IgG: 73.5 AU/mL
Rubella: 1.78 Index
Rubeola IgG: 178 AU/mL

## 2021-11-29 LAB — VARICELLA ZOSTER ANTIBODY, IGG: Varicella IgG: 2364 index

## 2021-12-01 ENCOUNTER — Other Ambulatory Visit: Payer: Self-pay

## 2021-12-09 ENCOUNTER — Other Ambulatory Visit: Payer: Self-pay

## 2021-12-24 ENCOUNTER — Encounter: Payer: Self-pay | Admitting: Nurse Practitioner

## 2021-12-24 ENCOUNTER — Other Ambulatory Visit: Payer: Self-pay

## 2021-12-24 DIAGNOSIS — F419 Anxiety disorder, unspecified: Secondary | ICD-10-CM

## 2021-12-24 DIAGNOSIS — E119 Type 2 diabetes mellitus without complications: Secondary | ICD-10-CM

## 2021-12-24 DIAGNOSIS — E1165 Type 2 diabetes mellitus with hyperglycemia: Secondary | ICD-10-CM

## 2021-12-24 DIAGNOSIS — R739 Hyperglycemia, unspecified: Secondary | ICD-10-CM

## 2021-12-24 MED ORDER — METFORMIN HCL 500 MG PO TABS: 1000.0000 mg | ORAL_TABLET | Freq: Two times a day (BID) | ORAL | 1 refills | Status: DC

## 2021-12-24 MED ORDER — HYDROXYZINE PAMOATE 25 MG PO CAPS: 25.0000 mg | ORAL_CAPSULE | Freq: Every evening | ORAL | 1 refills | Status: DC | PRN

## 2022-01-26 ENCOUNTER — Other Ambulatory Visit: Payer: Self-pay

## 2022-01-27 ENCOUNTER — Other Ambulatory Visit: Payer: Self-pay

## 2022-02-03 ENCOUNTER — Other Ambulatory Visit: Payer: Self-pay

## 2022-02-03 ENCOUNTER — Ambulatory Visit: Payer: BC Managed Care – PPO | Admitting: Dermatology

## 2022-02-04 ENCOUNTER — Encounter: Payer: Self-pay | Admitting: Dermatology

## 2022-02-04 ENCOUNTER — Ambulatory Visit (INDEPENDENT_AMBULATORY_CARE_PROVIDER_SITE_OTHER): Payer: BC Managed Care – PPO | Admitting: Dermatology

## 2022-02-04 DIAGNOSIS — L700 Acne vulgaris: Secondary | ICD-10-CM | POA: Diagnosis not present

## 2022-02-04 NOTE — Patient Instructions (Signed)
f acne flares again: Can use cleanser with benzoyl peroxide once or twice daily and Effaclar topical at bedtime to face. If not controlled call office for an appointment.     Due to recent changes in healthcare laws, you may see results of your pathology and/or laboratory studies on MyChart before the doctors have had a chance to review them. We understand that in some cases there may be results that are confusing or concerning to you. Please understand that not all results are received at the same time and often the doctors may need to interpret multiple results in order to provide you with the best plan of care or course of treatment. Therefore, we ask that you please give [place] 2 business days to thoroughly review all your results before contacting the office for clarification. Should we see a critical lab result, you will be contacted sooner.   If You Need Anything After Your Visit  If you have any questions or concerns for your doctor, please call our main line at [phone] and press option 4 to reach your doctor's medical assistant. If no one answers, please leave a voicemail as directed and we will return your call as soon as possible. Messages left after 4 pm will be answered the following business day.   You may also send [place] a message via [place]. We typically respond to MyChart messages within 1-2 business days.  For prescription refills, please ask your pharmacy to contact our office. Our fax number is [phone].  If you have an urgent issue when the clinic is closed that cannot wait until the next business day, you can page your doctor at the number below.    Please note that while we do our best to be available for urgent issues outside of office hours, we are not available 24/7.   If you have an urgent issue and are unable to reach [place], you may choose to seek medical care at your doctor's office, retail clinic, urgent care center, or emergency room.  If you have a medical  emergency, please immediately call 911 or go to the emergency department.  Pager Numbers  - Dr. Nehemiah Massed: [phone]  - Dr. Laurence Ferrari: [phone]  - Dr. Nicole Kindred: [phone]  In the event of inclement weather, please call our main line at [phone] for an update on the status of any delays or closures.  Dermatology Medication Tips: Please keep the boxes that topical medications come in in order to help keep track of the instructions about where and how to use these. Pharmacies typically print the medication instructions only on the boxes and not directly on the medication tubes.   If your medication is too expensive, please contact our office at [phone] option 4 or send [place] a message through [place].   We are unable to tell what your co-pay for medications will be in advance as this is different depending on your insurance coverage. However, we may be able to find a substitute medication at lower cost or fill out paperwork to get insurance to cover a needed medication.   If a prior authorization is required to get your medication covered by your insurance company, please allow [place] 1-2 business days to complete this process.  Drug prices often vary depending on where the prescription is filled and some pharmacies may offer cheaper prices.  The website www.goodrx.com contains coupons for medications through different pharmacies. The prices here do not account for what the cost may be with help from insurance (it  may be cheaper with your insurance), but the website can give you the price if you did not use any insurance.  - You can print the associated coupon and take it with your prescription to the pharmacy.  - You may also stop by our office during regular business hours and pick up a GoodRx coupon card.  - If you need your prescription sent electronically to a different pharmacy, notify our office through [facility] or by phone at [phone] option 4.     Si Usted  Necesita Algo Despus de Su Visita  Tambin puede enviarnos un mensaje a travs de Pharmacist, community. Por lo general respondemos a los mensajes de MyChart en el transcurso de 1 a 2 das hbiles.  Para renovar recetas, por favor pida a su farmacia que se ponga en contacto con nuestra oficina. Harland Dingwall de fax es [place] [phone].  Si tiene un asunto urgente cuando la clnica est cerrada y que no puede esperar hasta el siguiente da hbil, puede llamar/localizar a su doctor(a) al nmero que aparece a continuacin.   Por favor, tenga en cuenta que aunque hacemos todo lo posible para estar disponibles para asuntos urgentes fuera del horario de [place], no estamos disponibles las 24 horas del da, los 7 das de la [place].   Si tiene un problema urgente y no puede comunicarse con nosotros, puede optar por buscar atencin mdica  en el consultorio de su doctor(a), en una clnica privada, en un centro de atencin urgente o en una sala de emergencias.  Si tiene Engineering geologist, por favor llame inmediatamente al 911 o vaya a la sala de emergencias.  Nmeros de bper  - Dr. Nehemiah Massed: [phone]  - Dra. Moye: [phone]  - Dra. Nicole Kindred: [phone]  En caso de inclemencias del [place], por favor llame a Johnsie Kindred principal al [phone] para una actualizacin sobre el [place] de cualquier retraso o cierre.  Consejos para la medicacin en dermatologa: Por favor, guarde las cajas en las que vienen los medicamentos de uso tpico para ayudarle a seguir las instrucciones sobre dnde y cmo usarlos. Las farmacias generalmente imprimen las instrucciones del medicamento slo en las cajas y no directamente en los tubos del [place].   Si su medicamento es muy caro, por favor, pngase en contacto con Zigmund Daniel llamando al [phone] y presione la opcin 4 o envenos un mensaje a travs de Pharmacist, community.   No podemos decirle cul ser su copago por los medicamentos por adelantado ya que esto es  diferente dependiendo de la cobertura de su seguro. Sin embargo, es posible que podamos encontrar un medicamento sustituto a Electrical engineer un formulario para que el seguro cubra el medicamento que se considera necesario.   Si se requiere una autorizacin previa para que su compaa de seguros [place] su medicamento, por favor permtanos de 1 a 2 das hbiles para completar este proceso.  Los precios de los medicamentos varan con frecuencia dependiendo del Environmental consultant de dnde se surte la receta y alguna farmacias pueden ofrecer precios ms baratos.  El sitio web www.goodrx.com tiene cupones para medicamentos de Airline pilot. Los precios aqu no tienen en cuenta lo que podra costar con la ayuda del seguro (puede ser ms barato con su seguro), pero el sitio web puede darle el precio si no utiliz Research scientist (physical sciences).  - Puede imprimir el cupn correspondiente y llevarlo con su receta a la farmacia.  - Tambin puede pasar por nuestra oficina durante el horario de atencin regular  y recoger una tarjeta de cupones de GoodRx.  - Si necesita que su receta se enve electrnicamente a una farmacia diferente, informe a nuestra oficina a travs de MyChart de [place] o por telfono llamando al [phone] y presione la opcin 4.

## 2022-02-04 NOTE — Progress Notes (Signed)
   Follow-Up Visit   Subjective  Nicole Chambers is a 31 y.o. female who presents for the following: Acne (Face. 3 month post Isotretinoin recheck. Dryness has normalized. Not using any topical acne medications at this time). Pt is satisfied with results.    The following portions of the chart were reviewed this encounter and updated as appropriate:      Review of Systems: No other skin or systemic complaints except as noted in HPI or Assessment and Plan.   Objective  Well appearing patient in no apparent distress; mood and affect are within normal limits.  A focused examination was performed including face, neck. Relevant physical exam findings are noted in the Assessment and Plan.  Head - Anterior (Face) Clear today   Assessment & Plan  Acne vulgaris Head - Anterior (Face)  Chronic condition with duration or expected duration over one year. Currently well-controlled. Good result with Isotretinoin therapy.  If acne flares again: Can use cleanser with benzoyl peroxide once or twice daily and Effaclar gel topical at bedtime to face. If not controlled call office for an appointment.    Return if symptoms worsen or fail to improve.  I, Emelia Salisbury, CMA, am acting as scribe for Brendolyn Patty, MD.  Documentation: I have reviewed the above documentation for accuracy and completeness, and I agree with the above.  Brendolyn Patty MD

## 2022-02-09 ENCOUNTER — Encounter: Payer: Self-pay | Admitting: Nurse Practitioner

## 2022-02-09 NOTE — Telephone Encounter (Signed)
I have scheduled you with Serafina Royals for tomorrow 10.31.2023 at  2.:20pm. Will that work for you?

## 2022-02-10 ENCOUNTER — Other Ambulatory Visit: Payer: Self-pay

## 2022-02-10 ENCOUNTER — Encounter: Payer: Self-pay | Admitting: Nurse Practitioner

## 2022-02-10 ENCOUNTER — Ambulatory Visit (INDEPENDENT_AMBULATORY_CARE_PROVIDER_SITE_OTHER): Payer: BC Managed Care – PPO | Admitting: Nurse Practitioner

## 2022-02-10 VITALS — BP 120/80 | HR 98 | Temp 98.6°F | Resp 18 | Ht 72.0 in | Wt 209.4 lb

## 2022-02-10 DIAGNOSIS — E1165 Type 2 diabetes mellitus with hyperglycemia: Secondary | ICD-10-CM

## 2022-02-10 DIAGNOSIS — E782 Mixed hyperlipidemia: Secondary | ICD-10-CM

## 2022-02-10 DIAGNOSIS — F419 Anxiety disorder, unspecified: Secondary | ICD-10-CM

## 2022-02-10 DIAGNOSIS — E663 Overweight: Secondary | ICD-10-CM

## 2022-02-10 DIAGNOSIS — F330 Major depressive disorder, recurrent, mild: Secondary | ICD-10-CM | POA: Insufficient documentation

## 2022-02-10 DIAGNOSIS — Z130 Encounter for screening for diseases of the blood and blood-forming organs and certain disorders involving the immune mechanism: Secondary | ICD-10-CM

## 2022-02-10 MED ORDER — ESCITALOPRAM OXALATE 10 MG PO TABS: 10.0000 mg | ORAL_TABLET | Freq: Every day | ORAL | 0 refills | Status: DC

## 2022-02-10 NOTE — Assessment & Plan Note (Signed)
Start taking Lexapro 10 mg daily follow-up in 4 weeks. 

## 2022-02-10 NOTE — Assessment & Plan Note (Signed)
Continue to work on lifestyle modification including eating a well-balanced diet with portion control and increasing physical activity.  Increase Ozempic to 0.5 mg weekly.

## 2022-02-10 NOTE — Assessment & Plan Note (Signed)
Start taking Lexapro 10 mg daily.  Follow-up in 4 weeks.

## 2022-02-10 NOTE — Assessment & Plan Note (Signed)
Continue taking atorvastatin 10 mg daily 

## 2022-02-10 NOTE — Progress Notes (Signed)
BP 120/80   Pulse 98   Temp 98.6 F (37 C) (Oral)   Resp 18   Ht 6' (1.829 m)   Wt 209 lb 6.4 oz (95 kg)   LMP 01/16/2022   SpO2 99%   BMI 28.40 kg/m    Subjective:    Patient ID: Nicole Chambers, female    DOB: 1990-07-30, 30 y.o.   MRN: [id number]  HPI: Nicole Chambers is a 31 y.o. female  Chief Complaint  Patient presents with   Diabetes   DM:  Her last A1C was 7.8 on 04/28/2021.  She reports her blood sugar has been running around 250 ish.  She is currently taking metformin 1000 mg BID and ozempic 0.25 mg weekly. Patient tried to increase to 0.5 mg a couple months ago but has some nausea and went back down to 0.25 mg weekly.  Will try again at the 0.5 mg dose.   Will also get labs.   Hyperlipidemia: her last LDL was 105 on 09/23/2021.  She is currently taking atorvastatin 10 mg daily. She denies any myalgia.    Overweight: She is currently 209 lbs with a BMI of 28.40.  She is currently on ozempic 0.25 mg weekly.   Anxiety/insomnia/depression:  She currently takes hydroxyzine 25 mg at bedtime. Her PHQ9 score is positive.  She says she has never been on depression medication before.  Discussed trying Lexapro.  Patient is agreement with plan.  He is going to follow-up in 4 weeks.     02/10/2022    2:39 PM 05/29/2021    1:09 PM 04/28/2021    1:34 PM  GAD 7 : Generalized Anxiety Score  Nervous, Anxious, on Edge 3 0 2  Control/stop worrying 3 0 2  Worry too much - different things 3 0 2  Trouble relaxing 3 0 2  Restless 1 0 2  Easily annoyed or irritable 3 0 2  Afraid - awful might happen 3 0 2  Total GAD 7 Score 19 0 14  Anxiety Difficulty Somewhat difficult Not difficult at all Somewhat difficult        02/10/2022    2:37 PM 02/10/2022    2:27 PM 11/25/2021    1:12 PM 05/29/2021    1:00 PM 04/28/2021    1:11 PM  Depression screen PHQ 2/9  Decreased Interest 3 0 0 0 1  Down, Depressed, Hopeless 1 0 0 0 0  PHQ - 2 Score 4 0 0 0 1  Altered sleeping 3    0  Tired, decreased  energy 3    0  Change in appetite 3    0  Feeling bad or failure about yourself  1    0  Trouble concentrating 3    0  Moving slowly or fidgety/restless 1    0  Suicidal thoughts 0    0  PHQ-9 Score 18    1  Difficult doing work/chores Somewhat difficult    Not difficult at all       Relevant past medical, surgical, family and social history reviewed and updated as indicated. Interim medical history since our last visit reviewed. Allergies and medications reviewed and updated.  Review of Systems  Constitutional: Negative for fever or weight change.  Respiratory: Negative for cough and shortness of breath.   Cardiovascular: Negative for chest pain or palpitations.  Gastrointestinal: Negative for abdominal pain, no bowel changes.  Musculoskeletal: Negative for gait problem or joint swelling.  Skin: Negative for rash.  Neurological: Negative for dizziness or headache.  No other specific complaints in a complete review of systems (except as listed in HPI above).      Objective:    BP 120/80   Pulse 98   Temp 98.6 F (37 C) (Oral)   Resp 18   Ht 6' (1.829 m)   Wt 209 lb 6.4 oz (95 kg)   LMP 01/16/2022   SpO2 99%   BMI 28.40 kg/m   Wt Readings from Last 3 Encounters:  02/10/22 209 lb 6.4 oz (95 kg)  11/25/21 205 lb (93 kg)  10/21/21 200 lb (90.7 kg)    Physical Exam  Constitutional: Patient appears well-developed and well-nourished. Overweight No distress.  HEENT: head atraumatic, normocephalic, pupils equal and reactive to light, neck supple Cardiovascular: Normal rate, regular rhythm and normal heart sounds.  No murmur heard. No BLE edema. Pulmonary/Chest: Effort normal and breath sounds normal. No respiratory distress. Abdominal: Soft.  There is no tenderness. Psychiatric: Patient has a normal mood and affect. behavior is normal. Judgment and thought content normal.  Diabetic Foot Exam - Simple   Simple Foot Form Diabetic Foot exam was performed with the following  findings: Yes 02/10/2022  2:30 PM  Visual Inspection No deformities, no ulcerations, no other skin breakdown bilaterally: Yes Sensation Testing Intact to touch and monofilament testing bilaterally: Yes Pulse Check Posterior Tibialis and Dorsalis pulse intact bilaterally: Yes Comments     Results for orders placed or performed in visit on 11/25/21  Varicella zoster antibody, IgG  Result Value Ref Range   Varicella IgG 2,364.00 index  Measles/Mumps/Rubella Immunity  Result Value Ref Range   Rubeola IgG 178.00 AU/mL   Mumps IgG 73.50 AU/mL   Rubella 1.78 Index  QuantiFERON-TB Gold Plus  Result Value Ref Range   QuantiFERON-TB Gold Plus NEGATIVE NEGATIVE   NIL 0.02 IU/mL   Mitogen-NIL 7.15 IU/mL   TB1-NIL 0.01 IU/mL   TB2-NIL <0.00 IU/mL      Assessment & Plan:   Problem List Items Addressed This Visit       Endocrine   Uncontrolled type 2 diabetes mellitus with hyperglycemia ([place]) - Primary    Continue taking metformin at 1000 mg 2 times a day.  Increase Ozempic to 0.5 mg weekly.  Getting labs today.      Relevant Orders   COMPLETE METABOLIC PANEL WITH GFR   Hemoglobin A1c   HM Diabetes Foot Exam (Completed)   Microalbumin / creatinine urine ratio     Other   Mixed hyperlipidemia    Continue taking atorvastatin 10 mg daily.      Relevant Orders   COMPLETE METABOLIC PANEL WITH GFR   Mild episode of recurrent major depressive disorder (HCC)    Start taking Lexapro 10 mg daily.  Follow-up in 4 weeks.      Relevant Medications   escitalopram (LEXAPRO) 10 MG tablet   Overweight (BMI 25.0-29.9)    Continue to work on lifestyle modification including eating a well-balanced diet with portion control and increasing physical activity.  Increase Ozempic to 0.5 mg weekly.      Relevant Orders   CBC with Differential/Platelet   COMPLETE METABOLIC PANEL WITH GFR   Hemoglobin A1c   TSH   Anxiety    Start taking Lexapro 10 mg daily follow-up in 4 weeks.       Relevant Medications   escitalopram (LEXAPRO) 10 MG tablet   Other Visit Diagnoses     Screening for deficiency anemia  Relevant Orders   CBC with Differential/Platelet       Follow up plan: Return in about 4 weeks (around 03/10/2022) for follow up.

## 2022-02-10 NOTE — Assessment & Plan Note (Signed)
Continue taking metformin at 1000 mg 2 times a day.  Increase Ozempic to 0.5 mg weekly.  Getting labs today.

## 2022-02-11 ENCOUNTER — Encounter: Payer: Self-pay | Admitting: Nurse Practitioner

## 2022-02-12 LAB — CBC WITH DIFFERENTIAL/PLATELET
Absolute Monocytes: 317 cells/uL (ref 200–950)
Basophils Absolute: 41 cells/uL (ref 0–200)
Basophils Relative: 0.6 %
Eosinophils Absolute: 179 cells/uL (ref 15–500)
Eosinophils Relative: 2.6 %
HCT: 37.3 % (ref 35.0–45.0)
Hemoglobin: 12 g/dL (ref 11.7–15.5)
Lymphs Abs: 2525 cells/uL (ref 850–3900)
MCH: 28 pg (ref 27.0–33.0)
MCHC: 32.2 g/dL (ref 32.0–36.0)
MCV: 87.1 fL (ref 80.0–100.0)
MPV: 11 fL (ref 7.5–12.5)
Monocytes Relative: 4.6 %
Neutro Abs: 3836 cells/uL (ref 1500–7800)
Neutrophils Relative %: 55.6 %
Platelets: 450 10*3/uL — ABNORMAL HIGH (ref 140–400)
RBC: 4.28 10*6/uL (ref 3.80–5.10)
RDW: 14.4 % (ref 11.0–15.0)
Total Lymphocyte: 36.6 %
WBC: 6.9 10*3/uL (ref 3.8–10.8)

## 2022-02-12 LAB — COMPLETE METABOLIC PANEL WITH GFR
AG Ratio: 1.4 (calc) (ref 1.0–2.5)
ALT: 14 U/L (ref 6–29)
AST: 12 U/L (ref 10–30)
Albumin: 4.3 g/dL (ref 3.6–5.1)
Alkaline phosphatase (APISO): 82 U/L (ref 31–125)
BUN: 7 mg/dL (ref 7–25)
CO2: 24 mmol/L (ref 20–32)
Calcium: 9.6 mg/dL (ref 8.6–10.2)
Chloride: 101 mmol/L (ref 98–110)
Creat: 0.57 mg/dL (ref 0.50–0.96)
Globulin: 3.1 g/dL (calc) (ref 1.9–3.7)
Glucose, Bld: 258 mg/dL — ABNORMAL HIGH (ref 65–99)
Potassium: 4 mmol/L (ref 3.5–5.3)
Sodium: 136 mmol/L (ref 135–146)
Total Bilirubin: 0.4 mg/dL (ref 0.2–1.2)
Total Protein: 7.4 g/dL (ref 6.1–8.1)
eGFR: 126 mL/min/{1.73_m2} (ref >=60)

## 2022-02-12 LAB — MICROALBUMIN / CREATININE URINE RATIO
Creatinine, Urine: 59 mg/dL (ref 20–275)
Microalb Creat Ratio: 5 mcg/mg creat (ref <30)
Microalb, Ur: 0.3 mg/dL

## 2022-02-12 LAB — HEMOGLOBIN A1C
Hgb A1c MFr Bld: 9.8 % of total Hgb — ABNORMAL HIGH (ref <5.7)
Mean Plasma Glucose: 235 mg/dL
eAG (mmol/L): 13 mmol/L

## 2022-02-12 LAB — TSH: TSH: 0.61 mIU/L

## 2022-03-09 ENCOUNTER — Other Ambulatory Visit: Payer: Self-pay | Admitting: Nurse Practitioner

## 2022-03-09 ENCOUNTER — Encounter: Payer: Self-pay | Admitting: Nurse Practitioner

## 2022-03-09 DIAGNOSIS — F419 Anxiety disorder, unspecified: Secondary | ICD-10-CM

## 2022-03-09 DIAGNOSIS — F330 Major depressive disorder, recurrent, mild: Secondary | ICD-10-CM

## 2022-03-10 ENCOUNTER — Telehealth (INDEPENDENT_AMBULATORY_CARE_PROVIDER_SITE_OTHER): Payer: BC Managed Care – PPO | Admitting: Nurse Practitioner

## 2022-03-10 ENCOUNTER — Other Ambulatory Visit: Payer: Self-pay

## 2022-03-10 DIAGNOSIS — F419 Anxiety disorder, unspecified: Secondary | ICD-10-CM

## 2022-03-10 DIAGNOSIS — F5105 Insomnia due to other mental disorder: Secondary | ICD-10-CM

## 2022-03-10 DIAGNOSIS — F330 Major depressive disorder, recurrent, mild: Secondary | ICD-10-CM

## 2022-03-10 DIAGNOSIS — F99 Mental disorder, not otherwise specified: Secondary | ICD-10-CM | POA: Insufficient documentation

## 2022-03-10 NOTE — Telephone Encounter (Signed)
Requested Prescriptions  Pending Prescriptions Disp Refills   escitalopram (LEXAPRO) 10 MG tablet [Pharmacy Med Name: ESCITALOPRAM 10MG  TABLETS] 90 tablet 0    Sig: TAKE 1 TABLET(10 MG) BY MOUTH DAILY     Psychiatry:  Antidepressants - SSRI Passed - 03/09/2022  3:30 AM      Passed - Completed PHQ-2 or PHQ-9 in the last 360 days      Passed - Valid encounter within last 6 months    Recent Outpatient Visits           Today Mild episode of recurrent major depressive disorder [facility])   [facility], FNP   4 weeks ago Uncontrolled type 2 diabetes mellitus with hyperglycemia [facility])   [facility], FNP   3 months ago Immunizations incomplete   [facility], FNP   9 months ago Anxiety   [facility], FNP   10 months ago Uncontrolled type 2 diabetes mellitus with hyperglycemia [facility])   [facility], Berniece Salines

## 2022-03-10 NOTE — Assessment & Plan Note (Signed)
Improvement noted on anxiety and depression with lexapro 10 mg daily. Will continue with current dose.

## 2022-03-10 NOTE — Progress Notes (Signed)
Name: Nicole Chambers   MRN: [id number]    DOB: March 24, 1991   Date:03/10/2022       Progress Note  Subjective  Chief Complaint  Chief Complaint  Patient presents with   Anxiety    4 week follow up    I connected with  Tkeya Burchill  on 03/10/22 at 12:45 pm by a video enabled telemedicine application and verified that I am speaking with the correct person using two identifiers.  I discussed the limitations of evaluation and management by telemedicine and the availability of in person appointments. The patient expressed understanding and agreed to proceed with a virtual visit  Staff also discussed with the patient that there may be a patient responsible charge related to this service. Patient Location: home  Provider Location: [facility] Additional Individuals present: alone  HPI  Anxiety/depression/insomnia:  She is taking hydroxyzine 25 mg at bedtime. She says this is helping her get some sleep.  Patient was started on lexapro 10 mg at last visit due to elevated PHQ9 and GAD score.  Patient reports she is doing well and would like to continue with current dose. She denies any side effects. Her PHQ9 and GAD scores have vastly improved.  Will continue with current dose.     03/10/2022   12:30 PM 02/10/2022    2:37 PM 02/10/2022    2:27 PM 11/25/2021    1:12 PM 05/29/2021    1:00 PM  Depression screen PHQ 2/9  Decreased Interest 0 3 0 0 0  Down, Depressed, Hopeless 0 1 0 0 0  PHQ - 2 Score 0 4 0 0 0  Altered sleeping 0 3     Tired, decreased energy 0 3     Change in appetite 0 3     Feeling bad or failure about yourself  0 1     Trouble concentrating 0 3     Moving slowly or fidgety/restless 0 1     Suicidal thoughts 0 0     PHQ-9 Score 0 18     Difficult doing work/chores Not difficult at all Somewhat difficult          03/10/2022   12:30 PM 02/10/2022    2:39 PM 05/29/2021    1:09 PM 04/28/2021    1:34 PM  GAD 7 : Generalized Anxiety Score  Nervous, Anxious, on Edge 0 3 0 2   Control/stop worrying 0 3 0 2  Worry too much - different things 0 3 0 2  Trouble relaxing 0 3 0 2  Restless 0 1 0 2  Easily annoyed or irritable 0 3 0 2  Afraid - awful might happen 0 3 0 2  Total GAD 7 Score 0 19 0 14  Anxiety Difficulty Not difficult at all Somewhat difficult Not difficult at all Somewhat difficult      Patient Active Problem List   Diagnosis Date Noted   Insomnia due to other mental disorder 03/10/2022   Mild episode of recurrent major depressive disorder (HCC) 02/10/2022   Overweight (BMI 25.0-29.9) 02/10/2022   Anxiety 02/10/2022   Mixed hyperlipidemia 01/22/2021   Menorrhagia with regular cycle 01/22/2021   Uncontrolled type 2 diabetes mellitus with hyperglycemia (HCC) 10/21/2020   Allergic rhinitis 05/23/2019   Reactive airway disease with acute exacerbation 05/23/2019    Social History   Tobacco Use   Smoking status: Never   Smokeless tobacco: Never   Tobacco comments:    tried cigarettes as a teenager  Substance Use  Topics   Alcohol use: Yes    Comment: wine occass 1-2 x a month, sometimes a beer     Current Outpatient Medications:    albuterol (VENTOLIN HFA) 108 (90 Base) MCG/ACT inhaler, Inhale 2 puffs into the lungs every 6 (six) hours as needed for wheezing or shortness of breath (chest tightness, coughing fit)., Disp: 18 g, Rfl: 0   atorvastatin (LIPITOR) 10 MG tablet, Take 1 tablet (10 mg total) by mouth at bedtime., Disp: 90 tablet, Rfl: 3   escitalopram (LEXAPRO) 10 MG tablet, TAKE 1 TABLET(10 MG) BY MOUTH DAILY, Disp: 90 tablet, Rfl: 0   hydrOXYzine (VISTARIL) 25 MG capsule, Take 1 capsule (25 mg total) by mouth at bedtime as needed., Disp: 90 capsule, Rfl: 1   ISOtretinoin (ABSORICA) 40 MG capsule, Take 1 capsule (40 mg total) by mouth daily. Take with food., Disp: 30 capsule, Rfl: 0   ketoconazole (NIZORAL) 2 % shampoo, Apply 1 application  topically 2 (two) times a week. Wash scalp 2 times weekly, let sit 5 minutes and rinse out,  Disp: 120 mL, Rfl: 6   metFORMIN (GLUCOPHAGE) 500 MG tablet, Take 2 tablets (1,000 mg total) by mouth 2 (two) times daily with a meal., Disp: 270 tablet, Rfl: 1   MILI 0.25-35 MG-MCG tablet, TAKE 1 TABLET BY MOUTH DAILY, Disp: 84 tablet, Rfl: 1   Semaglutide,0.25 or 0.5MG /DOS, (OZEMPIC, 0.25 OR 0.5 MG/DOSE,) 2 MG/3ML SOPN, Inject 0.25mg  into the skin once a week, Disp: 3 mL, Rfl: 3   triamcinolone cream (KENALOG) 0.1 %, Apply 1 application topically 2 (two) times daily as needed. For rash. Avoid applying to face, groin, and axilla. Use as directed. [place]-term use can cause thinning of the skin., Disp: 80 g, Rfl: 1  No Known Allergies  I personally reviewed active problem list, medication list, allergies, notes from last encounter with the patient/caregiver today.  ROS  Constitutional: Negative for fever or weight change.  Respiratory: Negative for cough and shortness of breath.   Cardiovascular: Negative for chest pain or palpitations.  Gastrointestinal: Negative for abdominal pain, no bowel changes.  Musculoskeletal: Negative for gait problem or joint swelling.  Skin: Negative for rash.  Neurological: Negative for dizziness or headache.  No other specific complaints in a complete review of systems (except as listed in HPI above).   Objective  Virtual encounter, vitals not obtained.  There is no height or weight on file to calculate BMI.  Nursing Note and Vital Signs reviewed.  Physical Exam  Awake, alert and oriented and speaking in complete sentences  No results found for this or any previous visit (from the past 72 hour(s)).  Assessment & Plan  Problem List Items Addressed This Visit       Other   Mild episode of recurrent major depressive disorder (HCC) - Primary    Improvement noted on anxiety and depression with lexapro 10 mg daily. Will continue with current dose.       Anxiety    Improvement noted on anxiety and depression with lexapro 10 mg daily. Will continue  with current dose.       Insomnia due to other mental disorder    Continue using hydroxyzine 25 mg at bedtime to help with insomnia.         -Red flags and when to present for emergency care or RTC including fever >101.86F, chest pain, shortness of breath, new/worsening/un-resolving symptoms,  reviewed with patient at time of visit. Follow up and care instructions discussed and provided  in AVS. - I discussed the assessment and treatment plan with the patient. The patient was provided an opportunity to ask questions and all were answered. The patient agreed with the plan and demonstrated an understanding of the instructions.  I provided 15 minutes of non-face-to-face time during this encounter.  Berniece Salines, FNP

## 2022-03-10 NOTE — Assessment & Plan Note (Signed)
Improvement noted on anxiety and depression with lexapro 10 mg daily. Will continue with current dose.  

## 2022-03-10 NOTE — Assessment & Plan Note (Signed)
Continue using hydroxyzine 25 mg at bedtime to help with insomnia.

## 2022-03-16 ENCOUNTER — Other Ambulatory Visit: Payer: Self-pay | Admitting: Nurse Practitioner

## 2022-03-16 ENCOUNTER — Other Ambulatory Visit: Payer: Self-pay

## 2022-03-17 ENCOUNTER — Other Ambulatory Visit: Payer: Self-pay

## 2022-03-17 MED FILL — Semaglutide Soln Pen-inj 0.25 or 0.5 MG/DOSE (2 MG/3ML): SUBCUTANEOUS | 28 days supply | Qty: 3 | Fill #0 | Status: CN

## 2022-03-17 NOTE — Telephone Encounter (Signed)
Requested Prescriptions  Pending Prescriptions Disp Refills   OZEMPIC, 0.25 OR 0.5 MG/DOSE, 2 MG/3ML SOPN [Pharmacy Med Name: Semaglutide,0.25 or 0.5MG /DOS, (OZEMPIC, 0.25 OR 0.5 MG/DOSE,) 2 MG/3ML Solution Pen-injector] 3 mL 0    Sig: Inject 0.25mg  into the skin once a week     Endocrinology:  Diabetes - GLP-1 Receptor Agonists - semaglutide Failed - 03/16/2022  5:40 PM      Failed - HBA1C in normal range and within 180 days    Hgb A1c MFr Bld  Date Value Ref Range Status  02/10/2022 9.8 (H) <5.7 % of total Hgb Final    Comment:    For someone without known diabetes, a hemoglobin A1c value of 6.5% or greater indicates that they may have  diabetes and this should be confirmed with a follow-up  test. . For someone with known diabetes, a value <7% indicates  that their diabetes is well controlled and a value  greater than or equal to 7% indicates suboptimal  control. A1c targets should be individualized based on  duration of diabetes, age, comorbid conditions, and  other considerations. . Currently, no consensus exists regarding use of hemoglobin A1c for diagnosis of diabetes for children. .          Passed - Cr in normal range and within 360 days    Creat  Date Value Ref Range Status  02/10/2022 0.57 0.50 - 0.96 mg/dL Final   Creatinine, Urine  Date Value Ref Range Status  02/10/2022 59 20 - 275 mg/dL Final         Passed - Valid encounter within last 6 months    Recent Outpatient Visits           1 week ago Mild episode of recurrent major depressive disorder [facility])   [facility] Della Goo F, FNP   1 month ago Uncontrolled type 2 diabetes mellitus with hyperglycemia [facility])   [facility] Berniece Salines, FNP   3 months ago Immunizations incomplete   [facility] Berniece Salines, FNP   9 months ago Anxiety   [facility] Della Goo F, FNP   10 months ago Uncontrolled type 2 diabetes  mellitus with hyperglycemia [facility])   [facility] Berniece Salines, [place]

## 2022-03-24 ENCOUNTER — Other Ambulatory Visit: Payer: Self-pay

## 2022-05-11 ENCOUNTER — Other Ambulatory Visit: Payer: Self-pay

## 2022-05-11 MED FILL — Semaglutide Soln Pen-inj 0.25 or 0.5 MG/DOSE (2 MG/3ML): SUBCUTANEOUS | 28 days supply | Qty: 3 | Fill #0 | Status: CN

## 2022-06-13 ENCOUNTER — Other Ambulatory Visit: Payer: Self-pay | Admitting: Nurse Practitioner

## 2022-06-13 DIAGNOSIS — F330 Major depressive disorder, recurrent, mild: Secondary | ICD-10-CM

## 2022-06-13 DIAGNOSIS — F419 Anxiety disorder, unspecified: Secondary | ICD-10-CM

## 2022-06-15 NOTE — Telephone Encounter (Signed)
Requested Prescriptions  Pending Prescriptions Disp Refills   escitalopram (LEXAPRO) 10 MG tablet [Pharmacy Med Name: ESCITALOPRAM '10MG'$  TABLETS] 90 tablet 0    Sig: TAKE 1 TABLET(10 MG) BY MOUTH DAILY     Psychiatry:  Antidepressants - SSRI Passed - 06/13/2022  3:30 AM      Passed - Completed PHQ-2 or PHQ-9 in the last 360 days      Passed - Valid encounter within last 6 months    Recent Outpatient Visits           3 months ago Mild episode of recurrent major depressive disorder [facility])   [facility] Serafina Royals F, FNP   4 months ago Uncontrolled type 2 diabetes mellitus with hyperglycemia [facility])   [facility] Bo Merino, FNP   6 months ago Immunizations incomplete   [facility] Bo Merino, FNP   1 year ago [place], FNP   1 year ago Uncontrolled type 2 diabetes mellitus with hyperglycemia [facility])   [facility] Bo Merino, [place]

## 2022-07-05 ENCOUNTER — Other Ambulatory Visit: Payer: Self-pay

## 2022-07-05 ENCOUNTER — Other Ambulatory Visit: Payer: Self-pay | Admitting: Nurse Practitioner

## 2022-07-05 DIAGNOSIS — F419 Anxiety disorder, unspecified: Secondary | ICD-10-CM

## 2022-07-05 DIAGNOSIS — F330 Major depressive disorder, recurrent, mild: Secondary | ICD-10-CM

## 2022-07-05 MED FILL — Semaglutide Soln Pen-inj 0.25 or 0.5 MG/DOSE (2 MG/3ML): SUBCUTANEOUS | 28 days supply | Qty: 3 | Fill #0 | Status: CN

## 2022-07-06 ENCOUNTER — Other Ambulatory Visit: Payer: Self-pay

## 2022-07-07 ENCOUNTER — Other Ambulatory Visit: Payer: Self-pay

## 2022-07-07 ENCOUNTER — Encounter: Payer: Self-pay | Admitting: Nurse Practitioner

## 2022-07-07 MED FILL — Semaglutide Soln Pen-inj 0.25 or 0.5 MG/DOSE (2 MG/3ML): SUBCUTANEOUS | 28 days supply | Qty: 3 | Fill #0 | Status: CN

## 2022-07-14 ENCOUNTER — Other Ambulatory Visit: Payer: Self-pay

## 2022-07-14 MED FILL — Semaglutide Soln Pen-inj 0.25 or 0.5 MG/DOSE (2 MG/3ML): SUBCUTANEOUS | 28 days supply | Qty: 3 | Fill #0 | Status: CN

## 2022-07-20 ENCOUNTER — Other Ambulatory Visit: Payer: Self-pay

## 2022-07-21 ENCOUNTER — Other Ambulatory Visit: Payer: Self-pay

## 2022-07-21 ENCOUNTER — Other Ambulatory Visit: Payer: Self-pay | Admitting: Nurse Practitioner

## 2022-07-21 DIAGNOSIS — E1165 Type 2 diabetes mellitus with hyperglycemia: Secondary | ICD-10-CM

## 2022-07-21 MED ORDER — TRULICITY 0.75 MG/0.5ML ~~LOC~~ SOAJ
0.7500 mg | SUBCUTANEOUS | 0 refills | Status: DC
  Filled 2022-07-21 – 2022-07-22 (×2): qty 2, 28d supply, fill #0

## 2022-07-22 ENCOUNTER — Other Ambulatory Visit: Payer: Self-pay

## 2022-08-13 ENCOUNTER — Other Ambulatory Visit: Payer: Self-pay

## 2022-08-13 ENCOUNTER — Encounter: Payer: Self-pay | Admitting: Nurse Practitioner

## 2022-08-13 ENCOUNTER — Other Ambulatory Visit: Payer: Self-pay | Admitting: Nurse Practitioner

## 2022-08-13 DIAGNOSIS — F330 Major depressive disorder, recurrent, mild: Secondary | ICD-10-CM

## 2022-08-13 DIAGNOSIS — E119 Type 2 diabetes mellitus without complications: Secondary | ICD-10-CM

## 2022-08-13 DIAGNOSIS — F419 Anxiety disorder, unspecified: Secondary | ICD-10-CM

## 2022-08-13 DIAGNOSIS — E1165 Type 2 diabetes mellitus with hyperglycemia: Secondary | ICD-10-CM

## 2022-08-13 DIAGNOSIS — R739 Hyperglycemia, unspecified: Secondary | ICD-10-CM

## 2022-08-13 MED ORDER — TRULICITY 0.75 MG/0.5ML ~~LOC~~ SOAJ
0.7500 mg | SUBCUTANEOUS | 0 refills | Status: DC
  Filled 2022-08-13: qty 2, 28d supply, fill #0

## 2022-08-13 MED ORDER — METFORMIN HCL 500 MG PO TABS
1000.0000 mg | ORAL_TABLET | Freq: Two times a day (BID) | ORAL | 3 refills | Status: DC
  Filled 2022-08-13: qty 120, 30d supply, fill #0
  Filled 2022-09-18: qty 120, 30d supply, fill #1
  Filled 2022-10-20: qty 120, 30d supply, fill #2
  Filled 2023-01-04 – 2023-01-29 (×2): qty 120, 30d supply, fill #3
  Filled 2023-02-27: qty 120, 30d supply, fill #4

## 2022-08-13 MED ORDER — ESCITALOPRAM OXALATE 10 MG PO TABS
10.0000 mg | ORAL_TABLET | Freq: Every day | ORAL | 0 refills | Status: DC
  Filled 2022-08-13: qty 56, 56d supply, fill #0
  Filled 2022-08-13: qty 34, 34d supply, fill #0
  Filled 2022-08-18: qty 30, 30d supply, fill #0
  Filled 2022-09-18: qty 30, 30d supply, fill #1
  Filled 2022-10-20: qty 30, 30d supply, fill #2

## 2022-08-13 NOTE — Telephone Encounter (Signed)
Requested Prescriptions  Pending Prescriptions Disp Refills   Dulaglutide (TRULICITY) 0.75 MG/0.5ML SOPN 2 mL 0    Sig: Inject 0.75 mg into the skin once a week.     Endocrinology:  Diabetes - GLP-1 Receptor Agonists Failed - 08/13/2022  2:29 AM      Failed - HBA1C is between 0 and 7.9 and within 180 days    Hgb A1c MFr Bld  Date Value Ref Range Status  02/10/2022 9.8 (H) <5.7 % of total Hgb Final    Comment:    For someone without known diabetes, a hemoglobin A1c value of 6.5% or greater indicates that they may have  diabetes and this should be confirmed with a follow-up  test. . For someone with known diabetes, a value <7% indicates  that their diabetes is well controlled and a value  greater than or equal to 7% indicates suboptimal  control. A1c targets should be individualized based on  duration of diabetes, age, comorbid conditions, and  other considerations. . Currently, no consensus exists regarding use of hemoglobin A1c for diagnosis of diabetes for children. Verna Czech - Valid encounter within last 6 months    Recent Outpatient Visits           5 months ago Mild episode of recurrent major depressive disorder [facility])   [facility] Della Goo F, FNP   6 months ago Uncontrolled type 2 diabetes mellitus with hyperglycemia [facility])   [facility] Berniece Salines, FNP   8 months ago Immunizations incomplete   [facility] Berniece Salines, FNP   1 year ago Anxiety   [facility] Della Goo F, FNP   1 year ago Uncontrolled type 2 diabetes mellitus with hyperglycemia [facility])   [facility] Berniece Salines, [place]

## 2022-08-18 ENCOUNTER — Other Ambulatory Visit: Payer: Self-pay

## 2022-09-12 ENCOUNTER — Other Ambulatory Visit: Payer: Self-pay | Admitting: Nurse Practitioner

## 2022-09-12 DIAGNOSIS — E1165 Type 2 diabetes mellitus with hyperglycemia: Secondary | ICD-10-CM

## 2022-09-14 ENCOUNTER — Other Ambulatory Visit: Payer: Self-pay

## 2022-09-14 NOTE — Telephone Encounter (Signed)
Requested medication (s) are due for refill today: yes  Requested medication (s) are on the active medication list: yes  Last refill:  08/13/22 2 ml  Future visit scheduled:no  Notes to clinic:  Attempted to call pt to make appt. But unable to leave VM. Sent pt message via MyChart to call office to schedule appt    Requested Prescriptions  Pending Prescriptions Disp Refills   Dulaglutide (TRULICITY) 0.75 MG/0.5ML SOPN 2 mL 0    Sig: Inject 0.75 mg into the skin once a week. Patient note: Please schedule appointment with provider before next refill is due to prevent medication interruption     Endocrinology:  Diabetes - GLP-1 Receptor Agonists Failed - 09/12/2022 12:52 PM      Failed - HBA1C is between 0 and 7.9 and within 180 days    Hgb A1c MFr Bld  Date Value Ref Range Status  02/10/2022 9.8 (H) <5.7 % of total Hgb Final    Comment:    For someone without known diabetes, a hemoglobin A1c value of 6.5% or greater indicates that they may have  diabetes and this should be confirmed with a follow-up  test. . For someone with known diabetes, a value <7% indicates  that their diabetes is well controlled and a value  greater than or equal to 7% indicates suboptimal  control. A1c targets should be individualized based on  duration of diabetes, age, comorbid conditions, and  other considerations. . Currently, no consensus exists regarding use of hemoglobin A1c for diagnosis of diabetes for children. .          Failed - Valid encounter within last 6 months    Recent Outpatient Visits           6 months ago Mild episode of recurrent major depressive disorder [facility])   [facility] Della Goo F, FNP   7 months ago Uncontrolled type 2 diabetes mellitus with hyperglycemia [facility])   [facility] Berniece Salines, FNP   9 months ago Immunizations incomplete   [facility] Berniece Salines, FNP   1 year ago  Anxiety   [facility] D[facility]oo F, FNP   1 year ago Uncontrolled type 2 diabetes mellitus with hyperglycemia [facility])   [facility] Berniece Salines, [place]

## 2022-09-15 ENCOUNTER — Other Ambulatory Visit: Payer: Self-pay

## 2022-09-18 ENCOUNTER — Other Ambulatory Visit: Payer: Self-pay

## 2022-09-18 ENCOUNTER — Other Ambulatory Visit: Payer: Self-pay | Admitting: Nurse Practitioner

## 2022-09-18 DIAGNOSIS — E1165 Type 2 diabetes mellitus with hyperglycemia: Secondary | ICD-10-CM

## 2022-09-18 NOTE — Telephone Encounter (Signed)
Lvm to schedule an appt for followup for meds.

## 2022-09-18 NOTE — Telephone Encounter (Signed)
Unable to refill per protocol, appointment needed. Duplicate request.  Requested Prescriptions  Pending Prescriptions Disp Refills   Dulaglutide (TRULICITY) 0.75 MG/0.5ML SOPN 2 mL 0    Sig: Inject 0.75 mg into the skin once a week. Patient note: Please schedule appointment with provider before next refill is due to prevent medication interruption     Endocrinology:  Diabetes - GLP-1 Receptor Agonists Failed - 09/18/2022  1:36 PM      Failed - HBA1C is between 0 and 7.9 and within 180 days    Hgb A1c MFr Bld  Date Value Ref Range Status  02/10/2022 9.8 (H) <5.7 % of total Hgb Final    Comment:    For someone without known diabetes, a hemoglobin A1c value of 6.5% or greater indicates that they may have  diabetes and this should be confirmed with a follow-up  test. . For someone with known diabetes, a value <7% indicates  that their diabetes is well controlled and a value  greater than or equal to 7% indicates suboptimal  control. A1c targets should be individualized based on  duration of diabetes, age, comorbid conditions, and  other considerations. . Currently, no consensus exists regarding use of hemoglobin A1c for diagnosis of diabetes for children. .          Failed - Valid encounter within last 6 months    Recent Outpatient Visits           6 months ago Mild episode of recurrent major depressive disorder [facility])   [place] [facility] Della Goo F, FNP   7 months ago Uncontrolled type 2 diabetes mellitus with hyperglycemia [facility])   [facility] Berniece Salines, FNP   9 months ago Immunizations incomplete   [facility] Berniece Salines, FNP   1 year ago Anxiety   [facility] Della Goo F, FNP   1 year ago Uncontrolled type 2 diabetes mellitus with hyperglycemia [facility])   [facility] Berniece Salines, [place]

## 2022-09-25 ENCOUNTER — Other Ambulatory Visit: Payer: Self-pay

## 2022-10-14 ENCOUNTER — Ambulatory Visit: Payer: 59 | Admitting: Nurse Practitioner

## 2022-10-14 NOTE — Progress Notes (Deleted)
There were no vitals taken for this visit.   Subjective:    Patient ID: Nicole Chambers, female    DOB: Apr 04, 1991, 32 y.o.   MRN: [id number]  HPI: Nicole Chambers is a 32 y.o. female  No chief complaint on file.  Diabetes, Type 2:  -Last A1c 9.8 -Medications: metformin 1000 mg two times a day, trulicity 0.75 mg weekly -Patient is compliant with the above medications and reports no side effects. *** -Checking BG at home: *** -Eye exam: due -Foot exam: utd -Microalbumin: utd -Statin: yes -PNA vaccine: no -Denies symptoms of hypoglycemia, polyuria, polydipsia, numbness extremities, foot ulcers/trauma.   HLD:  -Medications: atorvastatin 10 mg daily -Patient is compliant with above medications and reports no side effects. *** -Last lipid panel:  Lipid Panel     Component Value Date/Time   CHOL 200 (H) 09/23/2021 0713   TRIG 203 (H) 09/23/2021 0713   HDL 60 09/23/2021 0713   CHOLHDL 3.3 09/23/2021 0713   CHOLHDL 3.2 01/22/2021 1528   LDLCALC 105 (H) 09/23/2021 0713   LDLCALC 90 01/22/2021 1528   LABVLDL 35 09/23/2021 0713     Obesity:  Current weight : *** BMI: *** Treatment Tried: ozempic, currently on trulicity Comorbidities: DM, HLD, depression, anxiety   Depression/anxiety/insomnia Medication lexapro 10 mg daily, hydroxyzine 25 mg at bedtime Compliant yes Side effects none PHQ9 *** GAD ***      03/10/2022   12:30 PM 02/10/2022    2:39 PM 05/29/2021    1:09 PM 04/28/2021    1:34 PM  GAD 7 : Generalized Anxiety Score  Nervous, Anxious, on Edge 0 3 0 2  Control/stop worrying 0 3 0 2  Worry too much - different things 0 3 0 2  Trouble relaxing 0 3 0 2  Restless 0 1 0 2  Easily annoyed or irritable 0 3 0 2  Afraid - awful might happen 0 3 0 2  Total GAD 7 Score 0 19 0 14  Anxiety Difficulty Not difficult at all Somewhat difficult Not difficult at all Somewhat difficult        03/10/2022   12:30 PM 02/10/2022    2:37 PM 02/10/2022    2:27 PM 11/25/2021     1:12 PM 05/29/2021    1:00 PM  Depression screen PHQ 2/9  Decreased Interest 0 3 0 0 0  Down, Depressed, Hopeless 0 1 0 0 0  PHQ - 2 Score 0 4 0 0 0  Altered sleeping 0 3     Tired, decreased energy 0 3     Change in appetite 0 3     Feeling bad or failure about yourself  0 1     Trouble concentrating 0 3     Moving slowly or fidgety/restless 0 1     Suicidal thoughts 0 0     PHQ-9 Score 0 18     Difficult doing work/chores Not difficult at all Somewhat difficult          Relevant past medical, surgical, family and social history reviewed and updated as indicated. Interim medical history since our last visit reviewed. Allergies and medications reviewed and updated.  Review of Systems  Constitutional: Negative for fever or weight change.  Respiratory: Negative for cough and shortness of breath.   Cardiovascular: Negative for chest pain or palpitations.  Gastrointestinal: Negative for abdominal pain, no bowel changes.  Musculoskeletal: Negative for gait problem or joint swelling.  Skin: Negative for rash.  Neurological: Negative for  dizziness or headache.  No other specific complaints in a complete review of systems (except as listed in HPI above).      Objective:    There were no vitals taken for this visit.  Wt Readings from Last 3 Encounters:  02/10/22 209 lb 6.4 oz (95 kg)  11/25/21 205 lb (93 kg)  10/21/21 200 lb (90.7 kg)    Physical Exam  Constitutional: Patient appears well-developed and well-nourished. Overweight No distress.  HEENT: head atraumatic, normocephalic, pupils equal and reactive to light, neck supple Cardiovascular: Normal rate, regular rhythm and normal heart sounds.  No murmur heard. No BLE edema. Pulmonary/Chest: Effort normal and breath sounds normal. No respiratory distress. Abdominal: Soft.  There is no tenderness. Psychiatric: Patient has a normal mood and affect. behavior is normal. Judgment and thought content normal.  Diabetic Foot Exam -  Simple   No data filed     Results for orders placed or performed in visit on 02/10/22  CBC with Differential/Platelet  Result Value Ref Range   WBC 6.9 3.8 - 10.8 Thousand/uL   RBC 4.28 3.80 - 5.10 Million/uL   Hemoglobin 12.0 11.7 - 15.5 g/dL   HCT 16.1 09.6 - 04.5 %   MCV 87.1 80.0 - 100.0 fL   MCH 28.0 27.0 - 33.0 pg   MCHC 32.2 32.0 - 36.0 g/dL   RDW 40.9 81.1 - 91.4 %   Platelets 450 (H) 140 - 400 Thousand/uL   MPV 11.0 7.5 - 12.5 fL   Neutro Abs 3,836 1,500 - 7,800 cells/uL   Lymphs Abs 2,525 850 - 3,900 cells/uL   Absolute Monocytes 317 200 - 950 cells/uL   Eosinophils Absolute 179 15 - 500 cells/uL   Basophils Absolute 41 0 - 200 cells/uL   Neutrophils Relative % 55.6 %   Total Lymphocyte 36.6 %   Monocytes Relative 4.6 %   Eosinophils Relative 2.6 %   Basophils Relative 0.6 %  COMPLETE METABOLIC PANEL WITH GFR  Result Value Ref Range   Glucose, Bld 258 (H) 65 - 99 mg/dL   BUN 7 7 - 25 mg/dL   Creat 7.82 9.56 - 2.13 mg/dL   eGFR 086 > OR = 60 VH/QIO/9.62X5   BUN/Creatinine Ratio SEE NOTE: 6 - 22 (calc)   Sodium 136 135 - 146 mmol/L   Potassium 4.0 3.5 - 5.3 mmol/L   Chloride 101 98 - 110 mmol/L   CO2 24 20 - 32 mmol/L   Calcium 9.6 8.6 - 10.2 mg/dL   Total Protein 7.4 6.1 - 8.1 g/dL   Albumin 4.3 3.6 - 5.1 g/dL   Globulin 3.1 1.9 - 3.7 g/dL (calc)   AG Ratio 1.4 1.0 - 2.5 (calc)   Total Bilirubin 0.4 0.2 - 1.2 mg/dL   Alkaline phosphatase (APISO) 82 31 - 125 U/L   AST 12 10 - 30 U/L   ALT 14 6 - 29 U/L  Hemoglobin A1c  Result Value Ref Range   Hgb A1c MFr Bld 9.8 (H) <5.7 % of total Hgb   Mean Plasma Glucose 235 mg/dL   eAG (mmol/L) 28.4 mmol/L  TSH  Result Value Ref Range   TSH 0.61 mIU/L  Microalbumin / creatinine urine ratio  Result Value Ref Range   Creatinine, Urine 59 20 - 275 mg/dL   Microalb, Ur 0.3 mg/dL   Microalb Creat Ratio 5 <30 mcg/mg creat      Assessment & Plan:   Problem List Items Addressed This Visit   None  Follow up  plan: No follow-ups on file.

## 2022-10-26 NOTE — Progress Notes (Deleted)
 There were no vitals taken for this visit.   Subjective:    Patient ID: Nicole Chambers, female    DOB: Apr 04, 1991, 32 y.o.   MRN: [id number]  HPI: Nicole Chambers is a 32 y.o. female  No chief complaint on file.  Diabetes, Type 2:  -Last A1c 9.8 -Medications: metformin 1000 mg two times a day, trulicity 0.75 mg weekly -Patient is compliant with the above medications and reports no side effects. *** -Checking BG at home: *** -Eye exam: due -Foot exam: utd -Microalbumin: utd -Statin: yes -PNA vaccine: no -Denies symptoms of hypoglycemia, polyuria, polydipsia, numbness extremities, foot ulcers/trauma.   HLD:  -Medications: atorvastatin 10 mg daily -Patient is compliant with above medications and reports no side effects. *** -Last lipid panel:  Lipid Panel     Component Value Date/Time   CHOL 200 (H) 09/23/2021 0713   TRIG 203 (H) 09/23/2021 0713   HDL 60 09/23/2021 0713   CHOLHDL 3.3 09/23/2021 0713   CHOLHDL 3.2 01/22/2021 1528   LDLCALC 105 (H) 09/23/2021 0713   LDLCALC 90 01/22/2021 1528   LABVLDL 35 09/23/2021 0713     Obesity:  Current weight : *** BMI: *** Treatment Tried: ozempic, currently on trulicity Comorbidities: DM, HLD, depression, anxiety   Depression/anxiety/insomnia Medication lexapro 10 mg daily, hydroxyzine 25 mg at bedtime Compliant yes Side effects none PHQ9 *** GAD ***      03/10/2022   12:30 PM 02/10/2022    2:39 PM 05/29/2021    1:09 PM 04/28/2021    1:34 PM  GAD 7 : Generalized Anxiety Score  Nervous, Anxious, on Edge 0 3 0 2  Control/stop worrying 0 3 0 2  Worry too much - different things 0 3 0 2  Trouble relaxing 0 3 0 2  Restless 0 1 0 2  Easily annoyed or irritable 0 3 0 2  Afraid - awful might happen 0 3 0 2  Total GAD 7 Score 0 19 0 14  Anxiety Difficulty Not difficult at all Somewhat difficult Not difficult at all Somewhat difficult        03/10/2022   12:30 PM 02/10/2022    2:37 PM 02/10/2022    2:27 PM 11/25/2021     1:12 PM 05/29/2021    1:00 PM  Depression screen PHQ 2/9  Decreased Interest 0 3 0 0 0  Down, Depressed, Hopeless 0 1 0 0 0  PHQ - 2 Score 0 4 0 0 0  Altered sleeping 0 3     Tired, decreased energy 0 3     Change in appetite 0 3     Feeling bad or failure about yourself  0 1     Trouble concentrating 0 3     Moving slowly or fidgety/restless 0 1     Suicidal thoughts 0 0     PHQ-9 Score 0 18     Difficult doing work/chores Not difficult at all Somewhat difficult          Relevant past medical, surgical, family and social history reviewed and updated as indicated. Interim medical history since our last visit reviewed. Allergies and medications reviewed and updated.  Review of Systems  Constitutional: Negative for fever or weight change.  Respiratory: Negative for cough and shortness of breath.   Cardiovascular: Negative for chest pain or palpitations.  Gastrointestinal: Negative for abdominal pain, no bowel changes.  Musculoskeletal: Negative for gait problem or joint swelling.  Skin: Negative for rash.  Neurological: Negative for  dizziness or headache.  No other specific complaints in a complete review of systems (except as listed in HPI above).      Objective:    There were no vitals taken for this visit.  Wt Readings from Last 3 Encounters:  02/10/22 209 lb 6.4 oz (95 kg)  11/25/21 205 lb (93 kg)  10/21/21 200 lb (90.7 kg)    Physical Exam  Constitutional: Patient appears well-developed and well-nourished. Overweight No distress.  HEENT: head atraumatic, normocephalic, pupils equal and reactive to light, neck supple Cardiovascular: Normal rate, regular rhythm and normal heart sounds.  No murmur heard. No BLE edema. Pulmonary/Chest: Effort normal and breath sounds normal. No respiratory distress. Abdominal: Soft.  There is no tenderness. Psychiatric: Patient has a normal mood and affect. behavior is normal. Judgment and thought content normal.  Diabetic Foot Exam -  Simple   No data filed     Results for orders placed or performed in visit on 02/10/22  CBC with Differential/Platelet  Result Value Ref Range   WBC 6.9 3.8 - 10.8 Thousand/uL   RBC 4.28 3.80 - 5.10 Million/uL   Hemoglobin 12.0 11.7 - 15.5 g/dL   HCT 16.1 09.6 - 04.5 %   MCV 87.1 80.0 - 100.0 fL   MCH 28.0 27.0 - 33.0 pg   MCHC 32.2 32.0 - 36.0 g/dL   RDW 40.9 81.1 - 91.4 %   Platelets 450 (H) 140 - 400 Thousand/uL   MPV 11.0 7.5 - 12.5 fL   Neutro Abs 3,836 1,500 - 7,800 cells/uL   Lymphs Abs 2,525 850 - 3,900 cells/uL   Absolute Monocytes 317 200 - 950 cells/uL   Eosinophils Absolute 179 15 - 500 cells/uL   Basophils Absolute 41 0 - 200 cells/uL   Neutrophils Relative % 55.6 %   Total Lymphocyte 36.6 %   Monocytes Relative 4.6 %   Eosinophils Relative 2.6 %   Basophils Relative 0.6 %  COMPLETE METABOLIC PANEL WITH GFR  Result Value Ref Range   Glucose, Bld 258 (H) 65 - 99 mg/dL   BUN 7 7 - 25 mg/dL   Creat 7.82 9.56 - 2.13 mg/dL   eGFR 086 > OR = 60 VH/QIO/9.62X5   BUN/Creatinine Ratio SEE NOTE: 6 - 22 (calc)   Sodium 136 135 - 146 mmol/L   Potassium 4.0 3.5 - 5.3 mmol/L   Chloride 101 98 - 110 mmol/L   CO2 24 20 - 32 mmol/L   Calcium 9.6 8.6 - 10.2 mg/dL   Total Protein 7.4 6.1 - 8.1 g/dL   Albumin 4.3 3.6 - 5.1 g/dL   Globulin 3.1 1.9 - 3.7 g/dL (calc)   AG Ratio 1.4 1.0 - 2.5 (calc)   Total Bilirubin 0.4 0.2 - 1.2 mg/dL   Alkaline phosphatase (APISO) 82 31 - 125 U/L   AST 12 10 - 30 U/L   ALT 14 6 - 29 U/L  Hemoglobin A1c  Result Value Ref Range   Hgb A1c MFr Bld 9.8 (H) <5.7 % of total Hgb   Mean Plasma Glucose 235 mg/dL   eAG (mmol/L) 28.4 mmol/L  TSH  Result Value Ref Range   TSH 0.61 mIU/L  Microalbumin / creatinine urine ratio  Result Value Ref Range   Creatinine, Urine 59 20 - 275 mg/dL   Microalb, Ur 0.3 mg/dL   Microalb Creat Ratio 5 <30 mcg/mg creat      Assessment & Plan:   Problem List Items Addressed This Visit   None  Follow up  plan: No follow-ups on file.

## 2022-10-27 ENCOUNTER — Ambulatory Visit: Payer: 59 | Admitting: Nurse Practitioner

## 2022-10-28 NOTE — Progress Notes (Signed)
BP 118/78   Pulse 98   Temp 98 F (36.7 C) (Oral)   Resp 16   Ht 6' (1.829 m)   Wt 207 lb 12.8 oz (94.3 kg)   LMP 10/17/2022   SpO2 99%   BMI 28.18 kg/m    Subjective:    Patient ID: Nicole Chambers, female    DOB: Aug 02, 1990, 32 y.o.   MRN: [id number]  HPI: Nicole Chambers is a 32 y.o. female  Chief Complaint  Patient presents with   Diabetes   Depression   Diabetes, Type 2:  -Last A1c 9.8 -Medications: metformin 1000 mg two times a day, trulicity 0.75 mg weekly ( she has been out for her trulicity for a few weeks) -Patient is compliant with the above medications and reports no side effects.  -Checking BG at home: 140s -Eye exam: due -Foot exam: utd -Microalbumin: utd -Statin: yes -PNA vaccine: no -Denies symptoms of hypoglycemia, polyuria, polydipsia, numbness extremities, foot ulcers/trauma.   HLD:  -Medications: atorvastatin 10 mg daily -Patient is compliant with above medications and reports no side effects.  -Last lipid panel:  Lipid Panel     Component Value Date/Time   CHOL 200 (H) 09/23/2021 0713   TRIG 203 (H) 09/23/2021 0713   HDL 60 09/23/2021 0713   CHOLHDL 3.3 09/23/2021 0713   CHOLHDL 3.2 01/22/2021 1528   LDLCALC 105 (H) 09/23/2021 0713   LDLCALC 90 01/22/2021 1528   LABVLDL 35 09/23/2021 0713     Overweight:  Current weight : 207 lbs BMI: 28.18 Treatment Tried: ozempic, currently on trulicity Comorbidities: DM, HLD, depression, anxiety   Depression/anxiety/insomnia Medication lexapro 10 mg daily, hydroxyzine 25 mg at bedtime Compliant yes Side effects none PHQ9 negative GAD negative      10/29/2022    3:36 PM 03/10/2022   12:30 PM 02/10/2022    2:39 PM 05/29/2021    1:09 PM  GAD 7 : Generalized Anxiety Score  Nervous, Anxious, on Edge 0 0 3 0  Control/stop worrying 0 0 3 0  Worry too much - different things 0 0 3 0  Trouble relaxing 0 0 3 0  Restless 0 0 1 0  Easily annoyed or irritable 0 0 3 0  Afraid - awful might happen 0 0 3  0  Total GAD 7 Score 0 0 19 0  Anxiety Difficulty Not difficult at all Not difficult at all Somewhat difficult Not difficult at all        10/29/2022    3:36 PM 03/10/2022   12:30 PM 02/10/2022    2:37 PM 02/10/2022    2:27 PM 11/25/2021    1:12 PM  Depression screen PHQ 2/9  Decreased Interest 0 0 3 0 0  Down, Depressed, Hopeless 0 0 1 0 0  PHQ - 2 Score 0 0 4 0 0  Altered sleeping 0 0 3    Tired, decreased energy 0 0 3    Change in appetite 0 0 3    Feeling bad or failure about yourself  0 0 1    Trouble concentrating 0 0 3    Moving slowly or fidgety/restless 0 0 1    Suicidal thoughts 0 0 0    PHQ-9 Score 0 0 18    Difficult doing work/chores Not difficult at all Not difficult at all Somewhat difficult       Fatigue: patient reports that she is feeling fatigued. She reports this has been going on for some time.  She says  that she does not sleep the best.  She says that it is hit or miss on the getting sleep. Recommend doing a bedtime routine, limit cell phone use before bed.  Decrease caffeine in diet.  Will get labs.   Relevant past medical, surgical, family and social history reviewed and updated as indicated. Interim medical history since our last visit reviewed. Allergies and medications reviewed and updated.  Review of Systems  Constitutional: Negative for fever or weight change.  Respiratory: Negative for cough and shortness of breath.   Cardiovascular: Negative for chest pain or palpitations.  Gastrointestinal: Negative for abdominal pain, no bowel changes.  Musculoskeletal: Negative for gait problem or joint swelling.  Skin: Negative for rash.  Neurological: Negative for dizziness or headache.  No other specific complaints in a complete review of systems (except as listed in HPI above).      Objective:    BP 118/78   Pulse 98   Temp 98 F (36.7 C) (Oral)   Resp 16   Ht 6' (1.829 m)   Wt 207 lb 12.8 oz (94.3 kg)   LMP 10/17/2022   SpO2 99%   BMI 28.18  kg/m   Wt Readings from Last 3 Encounters:  10/29/22 207 lb 12.8 oz (94.3 kg)  02/10/22 209 lb 6.4 oz (95 kg)  11/25/21 205 lb (93 kg)    Physical Exam  Constitutional: Patient appears well-developed and well-nourished. Overweight No distress.  HEENT: head atraumatic, normocephalic, pupils equal and reactive to light, neck supple Cardiovascular: Normal rate, regular rhythm and normal heart sounds.  No murmur heard. No BLE edema. Pulmonary/Chest: Effort normal and breath sounds normal. No respiratory distress. Abdominal: Soft.  There is no tenderness. Psychiatric: Patient has a normal mood and affect. behavior is normal. Judgment and thought content normal.   Results for orders placed or performed in visit on 02/10/22  CBC with Differential/Platelet  Result Value Ref Range   WBC 6.9 3.8 - 10.8 Thousand/uL   RBC 4.28 3.80 - 5.10 Million/uL   Hemoglobin 12.0 11.7 - 15.5 g/dL   HCT 16.1 09.6 - 04.5 %   MCV 87.1 80.0 - 100.0 fL   MCH 28.0 27.0 - 33.0 pg   MCHC 32.2 32.0 - 36.0 g/dL   RDW 40.9 81.1 - 91.4 %   Platelets 450 (H) 140 - 400 Thousand/uL   MPV 11.0 7.5 - 12.5 fL   Neutro Abs 3,836 1,500 - 7,800 cells/uL   Lymphs Abs 2,525 850 - 3,900 cells/uL   Absolute Monocytes 317 200 - 950 cells/uL   Eosinophils Absolute 179 15 - 500 cells/uL   Basophils Absolute 41 0 - 200 cells/uL   Neutrophils Relative % 55.6 %   Total Lymphocyte 36.6 %   Monocytes Relative 4.6 %   Eosinophils Relative 2.6 %   Basophils Relative 0.6 %  COMPLETE METABOLIC PANEL WITH GFR  Result Value Ref Range   Glucose, Bld 258 (H) 65 - 99 mg/dL   BUN 7 7 - 25 mg/dL   Creat 7.82 9.56 - 2.13 mg/dL   eGFR 086 > OR = 60 VH/QIO/9.62X5   BUN/Creatinine Ratio SEE NOTE: 6 - 22 (calc)   Sodium 136 135 - 146 mmol/L   Potassium 4.0 3.5 - 5.3 mmol/L   Chloride 101 98 - 110 mmol/L   CO2 24 20 - 32 mmol/L   Calcium 9.6 8.6 - 10.2 mg/dL   Total Protein 7.4 6.1 - 8.1 g/dL   Albumin 4.3 3.6 - 5.1 g/dL  Globulin 3.1  1.9 - 3.7 g/dL (calc)   AG Ratio 1.4 1.0 - 2.5 (calc)   Total Bilirubin 0.4 0.2 - 1.2 mg/dL   Alkaline phosphatase (APISO) 82 31 - 125 U/L   AST 12 10 - 30 U/L   ALT 14 6 - 29 U/L  Hemoglobin A1c  Result Value Ref Range   Hgb A1c MFr Bld 9.8 (H) <5.7 % of total Hgb   Mean Plasma Glucose 235 mg/dL   eAG (mmol/L) 65.7 mmol/L  TSH  Result Value Ref Range   TSH 0.61 mIU/L  Microalbumin / creatinine urine ratio  Result Value Ref Range   Creatinine, Urine 59 20 - 275 mg/dL   Microalb, Ur 0.3 mg/dL   Microalb Creat Ratio 5 <30 mcg/mg creat      Assessment & Plan:   Problem List Items Addressed This Visit       Endocrine   Uncontrolled type 2 diabetes mellitus with hyperglycemia (HCC) - Primary    Currently taking  metformin 1000 mg two times a day, trulicity 0.75 mg weekly ( she has been out for her trulicity for a few weeks)      Relevant Medications   Dulaglutide (TRULICITY) 0.75 MG/0.5ML SOPN   Other Relevant Orders   COMPLETE METABOLIC PANEL WITH GFR   Hemoglobin A1c     Other   Mixed hyperlipidemia    Continue taking atorvastatin 10 mg daily      Relevant Orders   Lipid panel   Mild episode of recurrent major depressive disorder (HCC)    Continue taking lexapro 10 mg daily and hydroxyzine 25 mg at bedtime prn      Overweight (BMI 25.0-29.9)    Continue working on lifestyle modification      Anxiety    Continue taking lexapro 10 mg daily and hydroxyzine 25 mg at bedtime prn      Insomnia due to other mental disorder    Continue taking lexapro 10 mg daily and hydroxyzine 25 mg at bedtime prn      Other Visit Diagnoses     Screening for HIV without presence of risk factors       Relevant Orders   HIV Antibody (routine testing w rflx)   Encounter for hepatitis C screening test for low risk patient       Relevant Orders   Hepatitis C antibody   Screening for deficiency anemia       Relevant Orders   CBC with Differential/Platelet   Need for Tdap  vaccination       Relevant Orders   Tdap vaccine greater than or equal to 7yo IM (Completed)   Other fatigue       Relevant Orders   VITAMIN D 25 Hydroxy (Vit-D Deficiency, Fractures)        Follow up plan: Return in about 4 months (around 03/01/2023) for follow up.

## 2022-10-29 ENCOUNTER — Other Ambulatory Visit: Payer: Self-pay

## 2022-10-29 ENCOUNTER — Ambulatory Visit (INDEPENDENT_AMBULATORY_CARE_PROVIDER_SITE_OTHER): Payer: 59 | Admitting: Nurse Practitioner

## 2022-10-29 ENCOUNTER — Encounter: Payer: Self-pay | Admitting: Nurse Practitioner

## 2022-10-29 VITALS — BP 118/78 | HR 98 | Temp 98.0°F | Resp 16 | Ht 72.0 in | Wt 207.8 lb

## 2022-10-29 DIAGNOSIS — E663 Overweight: Secondary | ICD-10-CM | POA: Diagnosis not present

## 2022-10-29 DIAGNOSIS — E1165 Type 2 diabetes mellitus with hyperglycemia: Secondary | ICD-10-CM | POA: Diagnosis not present

## 2022-10-29 DIAGNOSIS — F330 Major depressive disorder, recurrent, mild: Secondary | ICD-10-CM | POA: Diagnosis not present

## 2022-10-29 DIAGNOSIS — Z23 Encounter for immunization: Secondary | ICD-10-CM

## 2022-10-29 DIAGNOSIS — F5105 Insomnia due to other mental disorder: Secondary | ICD-10-CM

## 2022-10-29 DIAGNOSIS — F99 Mental disorder, not otherwise specified: Secondary | ICD-10-CM

## 2022-10-29 DIAGNOSIS — Z114 Encounter for screening for human immunodeficiency virus [HIV]: Secondary | ICD-10-CM

## 2022-10-29 DIAGNOSIS — E782 Mixed hyperlipidemia: Secondary | ICD-10-CM | POA: Diagnosis not present

## 2022-10-29 DIAGNOSIS — Z130 Encounter for screening for diseases of the blood and blood-forming organs and certain disorders involving the immune mechanism: Secondary | ICD-10-CM

## 2022-10-29 DIAGNOSIS — Z1159 Encounter for screening for other viral diseases: Secondary | ICD-10-CM

## 2022-10-29 DIAGNOSIS — F419 Anxiety disorder, unspecified: Secondary | ICD-10-CM

## 2022-10-29 DIAGNOSIS — R5383 Other fatigue: Secondary | ICD-10-CM

## 2022-10-29 MED ORDER — TRULICITY 0.75 MG/0.5ML ~~LOC~~ SOAJ
0.7500 mg | SUBCUTANEOUS | 3 refills | Status: DC
Start: 2022-10-29 — End: 2022-11-02
  Filled 2022-10-29 (×2): qty 2, 28d supply, fill #0

## 2022-10-29 NOTE — Assessment & Plan Note (Signed)
Continue working on lifestyle modification.  

## 2022-10-29 NOTE — Assessment & Plan Note (Signed)
Currently taking  metformin 1000 mg two times a day, trulicity 0.75 mg weekly ( she has been out for her trulicity for a few weeks)

## 2022-10-29 NOTE — Assessment & Plan Note (Signed)
Continue taking atorvastatin 10 mg daily 

## 2022-10-29 NOTE — Assessment & Plan Note (Signed)
Continue taking lexapro 10 mg daily and hydroxyzine 25 mg at bedtime prn

## 2022-10-30 ENCOUNTER — Encounter: Payer: Self-pay | Admitting: Nurse Practitioner

## 2022-10-30 ENCOUNTER — Other Ambulatory Visit: Payer: Self-pay

## 2022-10-30 LAB — COMPLETE METABOLIC PANEL WITH GFR
AG Ratio: 1.5 (calc) (ref 1.0–2.5)
ALT: 14 U/L (ref 6–29)
AST: 12 U/L (ref 10–30)
Albumin: 4.3 g/dL (ref 3.6–5.1)
Alkaline phosphatase (APISO): 72 U/L (ref 31–125)
BUN: 10 mg/dL (ref 7–25)
CO2: 24 mmol/L (ref 20–32)
Calcium: 9.4 mg/dL (ref 8.6–10.2)
Chloride: 101 mmol/L (ref 98–110)
Creat: 0.69 mg/dL (ref 0.50–0.97)
Globulin: 2.8 g/dL (calc) (ref 1.9–3.7)
Glucose, Bld: 320 mg/dL — ABNORMAL HIGH (ref 65–139)
Potassium: 3.8 mmol/L (ref 3.5–5.3)
Sodium: 136 mmol/L (ref 135–146)
Total Bilirubin: 0.3 mg/dL (ref 0.2–1.2)
Total Protein: 7.1 g/dL (ref 6.1–8.1)
eGFR: 120 mL/min/{1.73_m2} (ref >=60)

## 2022-10-30 LAB — HEMOGLOBIN A1C
Hgb A1c MFr Bld: 9 % of total Hgb — ABNORMAL HIGH (ref <5.7)
Mean Plasma Glucose: 212 mg/dL
eAG (mmol/L): 11.7 mmol/L

## 2022-10-30 LAB — CBC WITH DIFFERENTIAL/PLATELET
Absolute Monocytes: 338 cells/uL (ref 200–950)
Basophils Absolute: 28 cells/uL (ref 0–200)
Basophils Relative: 0.4 %
Eosinophils Absolute: 200 cells/uL (ref 15–500)
Eosinophils Relative: 2.9 %
HCT: 32.7 % — ABNORMAL LOW (ref 35.0–45.0)
Hemoglobin: 10.3 g/dL — ABNORMAL LOW (ref 11.7–15.5)
Lymphs Abs: 2732 cells/uL (ref 850–3900)
MCH: 27.5 pg (ref 27.0–33.0)
MCHC: 31.5 g/dL — ABNORMAL LOW (ref 32.0–36.0)
MCV: 87.2 fL (ref 80.0–100.0)
MPV: 10 fL (ref 7.5–12.5)
Monocytes Relative: 4.9 %
Neutro Abs: 3602 cells/uL (ref 1500–7800)
Neutrophils Relative %: 52.2 %
Platelets: 459 10*3/uL — ABNORMAL HIGH (ref 140–400)
RBC: 3.75 10*6/uL — ABNORMAL LOW (ref 3.80–5.10)
RDW: 14 % (ref 11.0–15.0)
Total Lymphocyte: 39.6 %
WBC: 6.9 10*3/uL (ref 3.8–10.8)

## 2022-10-30 LAB — LIPID PANEL
Cholesterol: 194 mg/dL (ref <200)
HDL: 52 mg/dL (ref >=50)
LDL Cholesterol (Calc): 116 mg/dL (calc) — ABNORMAL HIGH
Non-HDL Cholesterol (Calc): 142 mg/dL (calc) — ABNORMAL HIGH (ref <130)
Total CHOL/HDL Ratio: 3.7 (calc) (ref <5.0)
Triglycerides: 143 mg/dL (ref <150)

## 2022-10-30 LAB — HIV ANTIBODY (ROUTINE TESTING W REFLEX): HIV 1&2 Ab, 4th Generation: NONREACTIVE

## 2022-10-30 LAB — HEPATITIS C ANTIBODY: Hepatitis C Ab: NONREACTIVE

## 2022-10-30 LAB — VITAMIN D 25 HYDROXY (VIT D DEFICIENCY, FRACTURES): Vit D, 25-Hydroxy: 10 ng/mL — ABNORMAL LOW (ref 30–100)

## 2022-11-02 ENCOUNTER — Other Ambulatory Visit: Payer: Self-pay | Admitting: Nurse Practitioner

## 2022-11-02 ENCOUNTER — Other Ambulatory Visit: Payer: Self-pay

## 2022-11-02 DIAGNOSIS — E1165 Type 2 diabetes mellitus with hyperglycemia: Secondary | ICD-10-CM

## 2022-11-02 MED ORDER — LIRAGLUTIDE 18 MG/3ML ~~LOC~~ SOPN
0.6000 mg | PEN_INJECTOR | SUBCUTANEOUS | 0 refills | Status: DC
Start: 2022-11-02 — End: 2022-12-04

## 2022-11-03 ENCOUNTER — Other Ambulatory Visit: Payer: Self-pay | Admitting: Nurse Practitioner

## 2022-11-03 DIAGNOSIS — E1165 Type 2 diabetes mellitus with hyperglycemia: Secondary | ICD-10-CM

## 2022-12-04 ENCOUNTER — Encounter: Payer: Self-pay | Admitting: Nurse Practitioner

## 2022-12-04 ENCOUNTER — Other Ambulatory Visit: Payer: Self-pay | Admitting: Nurse Practitioner

## 2022-12-04 DIAGNOSIS — E1165 Type 2 diabetes mellitus with hyperglycemia: Secondary | ICD-10-CM

## 2022-12-04 MED ORDER — LIRAGLUTIDE 18 MG/3ML ~~LOC~~ SOPN
0.6000 mg | PEN_INJECTOR | Freq: Every day | SUBCUTANEOUS | Status: DC
Start: 2022-12-04 — End: 2022-12-17

## 2022-12-09 ENCOUNTER — Encounter: Payer: Self-pay | Admitting: Nurse Practitioner

## 2022-12-16 ENCOUNTER — Other Ambulatory Visit: Payer: Self-pay | Admitting: Nurse Practitioner

## 2022-12-16 DIAGNOSIS — E1165 Type 2 diabetes mellitus with hyperglycemia: Secondary | ICD-10-CM

## 2022-12-17 ENCOUNTER — Other Ambulatory Visit: Payer: Self-pay | Admitting: Nurse Practitioner

## 2022-12-17 NOTE — Telephone Encounter (Signed)
Requested medication (s) are due for refill today: ?  Requested medication (s) are on the active medication list:yes  Last refill:  indicates 12/04/22   Future visit scheduled: yes  Notes to clinic:  last order was no print and did not indicate amount to be dispense or any refills. Called requesting pharmacy and is waiting on this refill. Please advise   Requested Prescriptions  Pending Prescriptions Disp Refills   liraglutide (VICTOZA) 18 MG/3ML SOPN [Pharmacy Med Name: LIRAGLUTIDE 2-PAK 18 MG/3 ML]      Sig: INJECT 0.6MG  INTO SKIN DAILY     Endocrinology:  Diabetes - GLP-1 Receptor Agonists Failed - 12/16/2022  2:36 AM      Failed - HBA1C is between 0 and 7.9 and within 180 days    Hgb A1c MFr Bld  Date Value Ref Range Status  10/29/2022 9.0 (H) <5.7 % of total Hgb Final    Comment:    For someone without known diabetes, a hemoglobin A1c value of 6.5% or greater indicates that they may have  diabetes and this should be confirmed with a follow-up  test. . For someone with known diabetes, a value <7% indicates  that their diabetes is well controlled and a value  greater than or equal to 7% indicates suboptimal  control. A1c targets should be individualized based on  duration of diabetes, age, comorbid conditions, and  other considerations. . Currently, no consensus exists regarding use of hemoglobin A1c for diagnosis of diabetes for children. Verna Czech - Valid encounter within last 6 months    Recent Outpatient Visits           1 month ago Uncontrolled type 2 diabetes mellitus with hyperglycemia [facility])   [facility] Della Goo F, FNP   9 months ago Mild episode of recurrent major depressive disorder [facility])   [facility] Berniece Salines, FNP   10 months ago Uncontrolled type 2 diabetes mellitus with hyperglycemia [facility])   [facility] Berniece Salines, FNP   1 year ago Immunizations  incomplete   [facility] Berniece Salines, FNP   1 year ago Anxiety   [facility] Berniece Salines, FNP       Future Appointments             In 2 months Zane Herald, Rudolpho Sevin, FNP [facility], [facility]

## 2022-12-28 ENCOUNTER — Telehealth: Payer: Self-pay | Admitting: Nurse Practitioner

## 2022-12-28 NOTE — Telephone Encounter (Signed)
Pt is calling to schedule TB vaccine.  Please advise [phone]

## 2022-12-29 NOTE — Telephone Encounter (Signed)
Was calling the patient (but no vm set up just kept ringing )to let them know that we o not do the tb vaccine. They can go to [facility] for this.

## 2023-01-04 ENCOUNTER — Other Ambulatory Visit: Payer: Self-pay | Admitting: Nurse Practitioner

## 2023-01-04 ENCOUNTER — Other Ambulatory Visit: Payer: Self-pay

## 2023-01-04 DIAGNOSIS — F330 Major depressive disorder, recurrent, mild: Secondary | ICD-10-CM

## 2023-01-04 DIAGNOSIS — F419 Anxiety disorder, unspecified: Secondary | ICD-10-CM

## 2023-01-05 ENCOUNTER — Other Ambulatory Visit: Payer: Self-pay

## 2023-01-05 MED ORDER — ESCITALOPRAM OXALATE 10 MG PO TABS
10.0000 mg | ORAL_TABLET | Freq: Every day | ORAL | 0 refills | Status: DC
  Filled 2023-01-05 – 2023-01-29 (×2): qty 30, 30d supply, fill #0
  Filled 2023-02-27: qty 30, 30d supply, fill #1

## 2023-01-05 NOTE — Telephone Encounter (Signed)
Requested Prescriptions  Pending Prescriptions Disp Refills   escitalopram (LEXAPRO) 10 MG tablet 90 tablet 0    Sig: Take 1 tablet (10 mg total) by mouth daily.     Psychiatry:  Antidepressants - SSRI Passed - 01/04/2023  1:37 AM      Passed - Completed PHQ-2 or PHQ-9 in the last 360 days      Passed - Valid encounter within last 6 months    Recent Outpatient Visits           2 months ago Uncontrolled type 2 diabetes mellitus with hyperglycemia [facility])   [facility] Della Goo F, FNP   10 months ago Mild episode of recurrent major depressive disorder [facility])   [facility] Berniece Salines, FNP   10 months ago Uncontrolled type 2 diabetes mellitus with hyperglycemia [facility])   [facility] Berniece Salines, FNP   1 year ago Immunizations incomplete   [facility] Berniece Salines, FNP   1 year ago Anxiety   [facility] Berniece Salines, FNP       Future Appointments             In 1 month Zane Herald, Rudolpho Sevin, FNP [facility], [facility]

## 2023-01-08 ENCOUNTER — Other Ambulatory Visit: Payer: BC Managed Care – PPO

## 2023-01-15 ENCOUNTER — Other Ambulatory Visit: Payer: Self-pay

## 2023-01-27 ENCOUNTER — Encounter: Payer: Self-pay | Admitting: Nurse Practitioner

## 2023-01-29 ENCOUNTER — Other Ambulatory Visit: Payer: Self-pay

## 2023-03-01 ENCOUNTER — Other Ambulatory Visit: Payer: Self-pay

## 2023-03-01 ENCOUNTER — Ambulatory Visit (INDEPENDENT_AMBULATORY_CARE_PROVIDER_SITE_OTHER): Payer: BC Managed Care – PPO | Admitting: Nurse Practitioner

## 2023-03-01 ENCOUNTER — Encounter: Payer: Self-pay | Admitting: Nurse Practitioner

## 2023-03-01 VITALS — BP 112/72 | HR 96 | Temp 97.7°F | Resp 16 | Ht 72.0 in | Wt 207.1 lb

## 2023-03-01 DIAGNOSIS — E663 Overweight: Secondary | ICD-10-CM

## 2023-03-01 DIAGNOSIS — E1165 Type 2 diabetes mellitus with hyperglycemia: Secondary | ICD-10-CM

## 2023-03-01 DIAGNOSIS — L219 Seborrheic dermatitis, unspecified: Secondary | ICD-10-CM

## 2023-03-01 DIAGNOSIS — L308 Other specified dermatitis: Secondary | ICD-10-CM

## 2023-03-01 DIAGNOSIS — F5105 Insomnia due to other mental disorder: Secondary | ICD-10-CM

## 2023-03-01 DIAGNOSIS — F419 Anxiety disorder, unspecified: Secondary | ICD-10-CM

## 2023-03-01 DIAGNOSIS — R739 Hyperglycemia, unspecified: Secondary | ICD-10-CM

## 2023-03-01 DIAGNOSIS — E119 Type 2 diabetes mellitus without complications: Secondary | ICD-10-CM

## 2023-03-01 DIAGNOSIS — E782 Mixed hyperlipidemia: Secondary | ICD-10-CM | POA: Diagnosis not present

## 2023-03-01 DIAGNOSIS — F330 Major depressive disorder, recurrent, mild: Secondary | ICD-10-CM | POA: Diagnosis not present

## 2023-03-01 DIAGNOSIS — F99 Mental disorder, not otherwise specified: Secondary | ICD-10-CM

## 2023-03-01 DIAGNOSIS — Z7984 Long term (current) use of oral hypoglycemic drugs: Secondary | ICD-10-CM

## 2023-03-01 LAB — POCT GLYCOSYLATED HEMOGLOBIN (HGB A1C): Hemoglobin A1C: 8.8 % — AB (ref 4.0–5.6)

## 2023-03-01 MED ORDER — ESCITALOPRAM OXALATE 20 MG PO TABS
20.0000 mg | ORAL_TABLET | Freq: Every day | ORAL | 1 refills | Status: DC
Start: 2023-03-01 — End: 2023-12-31
  Filled 2023-03-01: qty 30, 30d supply, fill #0
  Filled 2023-04-20: qty 30, 30d supply, fill #1
  Filled 2023-05-31 – 2023-07-29 (×5): qty 30, 30d supply, fill #2
  Filled 2023-08-26: qty 30, 30d supply, fill #3
  Filled 2023-10-01: qty 30, 30d supply, fill #4
  Filled 2023-10-26: qty 30, 30d supply, fill #5

## 2023-03-01 MED ORDER — TRIAMCINOLONE ACETONIDE 0.1 % EX CREA
1.0000 | TOPICAL_CREAM | Freq: Two times a day (BID) | CUTANEOUS | 1 refills | Status: DC | PRN
  Filled 2023-03-01: qty 80, 30d supply, fill #0

## 2023-03-01 MED ORDER — LIRAGLUTIDE 18 MG/3ML ~~LOC~~ SOPN
1.2000 mg | PEN_INJECTOR | Freq: Every day | SUBCUTANEOUS | 5 refills | Status: DC
Start: 2023-03-01 — End: 2023-09-01
  Filled 2023-03-01 – 2023-03-15 (×4): qty 6, 30d supply, fill #0
  Filled 2023-04-20: qty 6, 30d supply, fill #1
  Filled 2023-05-09: qty 6, 30d supply, fill #2
  Filled 2023-06-15 – 2023-07-28 (×4): qty 6, 30d supply, fill #3
  Filled 2023-08-26: qty 6, 30d supply, fill #4

## 2023-03-01 MED ORDER — METFORMIN HCL 1000 MG PO TABS
1000.0000 mg | ORAL_TABLET | Freq: Two times a day (BID) | ORAL | 1 refills | Status: DC
  Filled 2023-03-01: qty 60, 30d supply, fill #0
  Filled 2023-04-20: qty 60, 30d supply, fill #1
  Filled 2023-05-31 – 2023-07-29 (×5): qty 60, 30d supply, fill #2
  Filled 2023-08-26: qty 60, 30d supply, fill #3
  Filled 2023-10-01: qty 60, 30d supply, fill #4
  Filled 2023-10-26: qty 60, 30d supply, fill #5

## 2023-03-01 MED ORDER — KETOCONAZOLE 2 % EX SHAM
1.0000 | MEDICATED_SHAMPOO | CUTANEOUS | 6 refills | Status: DC
  Filled 2023-03-01: qty 120, 30d supply, fill #0
  Filled 2024-02-09: qty 120, 30d supply, fill #1

## 2023-03-01 NOTE — Assessment & Plan Note (Signed)
Continue atorvastatin 10 mg daily. 

## 2023-03-01 NOTE — Assessment & Plan Note (Signed)
No longer taking hydroxyzine.

## 2023-03-01 NOTE — Progress Notes (Signed)
BP 112/72   Pulse 96   Temp 97.7 F (36.5 C) (Oral)   Resp 16   Ht 6' (1.829 m)   Wt 207 lb 1.6 oz (93.9 kg)   SpO2 99%   BMI 28.09 kg/m    Subjective:    Patient ID: Nicole Chambers, female    DOB: Aug 11, 1990, 32 y.o.   MRN: [id number]  HPI: Nicole Chambers is a 32 y.o. female  Chief Complaint  Patient presents with   Medical Management of Chronic Issues   Diabetes, Type 2:  -Last A1c 9.0, today 8.8 will increase victoza to 1.2 mg daily -Medications: victoza 0.6 mg daily, metformin 1000 mg BID -Patient is compliant with the above medications and reports no side effects.  -Checking BG at home: yes -Fasting home BG: 187 -Diet: reduce sugar and processed foods in your diet  -Exercise: recommend 150 min of physical activity weekly   -Eye exam: due -Foot exam: due -Microalbumin: due -Statin: yes -PNA vaccine: no -Denies symptoms of hypoglycemia, polyuria, polydipsia, numbness extremities, foot ulcers/trauma.    HLD:  -Medications: atorvastatin 10 mg daily -Patient is compliant with above medications and reports no side effects.  -Last lipid panel:  Lipid Panel     Component Value Date/Time   CHOL 194 10/29/2022 1547   CHOL 200 (H) 09/23/2021 0713   TRIG 143 10/29/2022 1547   HDL 52 10/29/2022 1547   HDL 60 09/23/2021 0713   CHOLHDL 3.7 10/29/2022 1547   LDLCALC 116 (H) 10/29/2022 1547   LABVLDL 35 09/23/2021 0713     Overweight:  Current weight : 207 lbs BMI: 28.09 Previous weight:207 lbs Treatment Tried: ozempic, trulicity, victoza Comorbidities: HLD,DM, depression, anxiety   Depression/anxiety/insomnia Medication lexapro 10 mg daily, hydroxyzine 25 mg  at bedtime, she would like to go up on the lexapro Compliant yes Side effects none PHQ9 negative GAD positive       03/01/2023    3:18 PM 10/29/2022    3:36 PM 03/10/2022   12:30 PM 02/10/2022    2:39 PM  GAD 7 : Generalized Anxiety Score  Nervous, Anxious, on Edge 1 0 0 3  Control/stop worrying 1 0 0  3  Worry too much - different things 1 0 0 3  Trouble relaxing 0 0 0 3  Restless 0 0 0 1  Easily annoyed or irritable 1 0 0 3  Afraid - awful might happen 0 0 0 3  Total GAD 7 Score 4 0 0 19  Anxiety Difficulty Not difficult at all Not difficult at all Not difficult at all Somewhat difficult        03/01/2023    3:16 PM 10/29/2022    3:36 PM 03/10/2022   12:30 PM 02/10/2022    2:37 PM 02/10/2022    2:27 PM  Depression screen PHQ 2/9  Decreased Interest 0 0 0 3 0  Down, Depressed, Hopeless 0 0 0 1 0  PHQ - 2 Score 0 0 0 4 0  Altered sleeping 0 0 0 3   Tired, decreased energy 2 0 0 3   Change in appetite 0 0 0 3   Feeling bad or failure about yourself  0 0 0 1   Trouble concentrating 1 0 0 3   Moving slowly or fidgety/restless 0 0 0 1   Suicidal thoughts  0 0 0   PHQ-9 Score 3 0 0 18   Difficult doing work/chores  Not difficult at all Not difficult at all Somewhat  difficult       Relevant past medical, surgical, family and social history reviewed and updated as indicated. Interim medical history since our last visit reviewed. Allergies and medications reviewed and updated.  Review of Systems  Constitutional: Negative for fever or weight change.  Respiratory: Negative for cough and shortness of breath.   Cardiovascular: Negative for chest pain or palpitations.  Gastrointestinal: Negative for abdominal pain, no bowel changes.  Musculoskeletal: Negative for gait problem or joint swelling.  Skin: Negative for rash.  Neurological: Negative for dizziness or headache.  No other specific complaints in a complete review of systems (except as listed in HPI above).      Objective:    BP 112/72   Pulse 96   Temp 97.7 F (36.5 C) (Oral)   Resp 16   Ht 6' (1.829 m)   Wt 207 lb 1.6 oz (93.9 kg)   SpO2 99%   BMI 28.09 kg/m   Wt Readings from Last 3 Encounters:  03/01/23 207 lb 1.6 oz (93.9 kg)  10/29/22 207 lb 12.8 oz (94.3 kg)  02/10/22 209 lb 6.4 oz (95 kg)    Physical  Exam  Constitutional: Patient appears well-developed and well-nourished. Overweight No distress.  HEENT: head atraumatic, normocephalic, pupils equal and reactive to light, neck supple Cardiovascular: Normal rate, regular rhythm and normal heart sounds.  No murmur heard. No BLE edema. Pulmonary/Chest: Effort normal and breath sounds normal. No respiratory distress. Abdominal: Soft.  There is no tenderness. Psychiatric: Patient has a normal mood and affect. behavior is normal. Judgment and thought content normal.   Diabetic Foot Exam - Simple   Simple Foot Form Diabetic Foot exam was performed with the following findings: Yes 03/01/2023  3:45 PM  Visual Inspection No deformities, no ulcerations, no other skin breakdown bilaterally: Yes Sensation Testing Intact to touch and monofilament testing bilaterally: Yes Pulse Check Posterior Tibialis and Dorsalis pulse intact bilaterally: Yes Comments     Results for orders placed or performed in visit on 03/01/23  POCT HgB A1C  Result Value Ref Range   Hemoglobin A1C 8.8 (A) 4.0 - 5.6 %   HbA1c POC (<> result, manual entry)     HbA1c, POC (prediabetic range)     HbA1c, POC (controlled diabetic range)        Assessment & Plan:   Problem List Items Addressed This Visit       Endocrine   Uncontrolled type 2 diabetes mellitus with hyperglycemia (HCC) - Primary    Increase Victoza to 1.2 mg daily.  Continue metformin at 1000 mg 2 times a day.  Continue to monitor blood sugar.      Relevant Medications   metFORMIN (GLUCOPHAGE) 1000 MG tablet   liraglutide (VICTOZA) 18 MG/3ML SOPN   Other Relevant Orders   POCT HgB A1C (Completed)   Microalbumin / creatinine urine ratio   HM Diabetes Foot Exam (Completed)     Other   Mixed hyperlipidemia    Continue atorvastatin 10 mg daily.      Mild episode of recurrent major depressive disorder (HCC)    Increase Lexapro to 20 mg daily.      Relevant Medications   escitalopram (LEXAPRO) 20  MG tablet   Overweight (BMI 25.0-29.9)    Continue working on lifestyle modification continue Victoza 1.2 mg      Anxiety    Increase Lexapro to 20 mg daily.      Relevant Medications   escitalopram (LEXAPRO) 20 MG tablet  Insomnia due to other mental disorder    No longer taking hydroxyzine.      Other Visit Diagnoses     Other eczema       Relevant Medications   triamcinolone cream (KENALOG) 0.1 %   Seborrheic dermatitis       Relevant Medications   ketoconazole (NIZORAL) 2 % shampoo   Hyperglycemia       Relevant Medications   metFORMIN (GLUCOPHAGE) 1000 MG tablet   New onset type 2 diabetes mellitus (HCC)       Relevant Medications   metFORMIN (GLUCOPHAGE) 1000 MG tablet   liraglutide (VICTOZA) 18 MG/3ML SOPN         Follow up plan: Return in about 3 months (around 06/01/2023) for follow up.

## 2023-03-01 NOTE — Assessment & Plan Note (Signed)
Increase Victoza to 1.2 mg daily.  Continue metformin at 1000 mg 2 times a day.  Continue to monitor blood sugar.

## 2023-03-01 NOTE — Assessment & Plan Note (Signed)
Continue working on lifestyle modification continue Victoza 1.2 mg

## 2023-03-01 NOTE — Assessment & Plan Note (Signed)
Increase Lexapro to 20 mg daily.

## 2023-03-02 ENCOUNTER — Other Ambulatory Visit: Payer: Self-pay

## 2023-03-02 LAB — MICROALBUMIN / CREATININE URINE RATIO
Creatinine, Urine: 261 mg/dL (ref 20–275)
Microalb Creat Ratio: 3 mg/g{creat} (ref <30)
Microalb, Ur: 0.9 mg/dL

## 2023-03-10 ENCOUNTER — Other Ambulatory Visit: Payer: Self-pay

## 2023-03-15 ENCOUNTER — Other Ambulatory Visit: Payer: Self-pay

## 2023-03-16 ENCOUNTER — Other Ambulatory Visit: Payer: Self-pay

## 2023-04-20 ENCOUNTER — Other Ambulatory Visit: Payer: Self-pay

## 2023-04-22 ENCOUNTER — Encounter: Payer: Self-pay | Admitting: Advanced Practice Midwife

## 2023-04-22 ENCOUNTER — Ambulatory Visit (INDEPENDENT_AMBULATORY_CARE_PROVIDER_SITE_OTHER): Payer: 59 | Admitting: Advanced Practice Midwife

## 2023-04-22 ENCOUNTER — Other Ambulatory Visit (HOSPITAL_COMMUNITY)
Admission: RE | Admit: 2023-04-22 | Discharge: 2023-04-22 | Disposition: A | Discharge status: Home / Self Care | Payer: 59 | Source: Ambulatory Visit | Attending: Advanced Practice Midwife | Admitting: Advanced Practice Midwife

## 2023-04-22 VITALS — BP 130/74 | HR 78 | Wt 204.4 lb

## 2023-04-22 DIAGNOSIS — Z124 Encounter for screening for malignant neoplasm of cervix: Secondary | ICD-10-CM

## 2023-04-22 DIAGNOSIS — Z01419 Encounter for gynecological examination (general) (routine) without abnormal findings: Secondary | ICD-10-CM | POA: Insufficient documentation

## 2023-04-22 DIAGNOSIS — Z113 Encounter for screening for infections with a predominantly sexual mode of transmission: Secondary | ICD-10-CM | POA: Insufficient documentation

## 2023-04-22 NOTE — Progress Notes (Signed)
 [facility]  Gynecology Annual Exam   PCP: Gareth Mliss FALCON, FNP  Chief Complaint:  Chief Complaint  Patient presents with   Gynecologic Exam    History of Present Illness: Patient is a 33 y.o. G1P0010 presents for annual exam. The patient has complaint today of daily brown discharge for the past 2 months- especially before and after her cycle. She had diagnosis of mild anemia 6 months ago. Not taking Fe.   LMP: Patient's last menstrual period was 04/07/2023 (exact date). Average Interval: regular, 28 days Duration of flow: 8 days Heavy Menses: no Clots: no Intermenstrual Bleeding: brown Postcoital Bleeding: no Dysmenorrhea: no  The patient is sexually active. She currently uses condoms for contraception. She denies dyspareunia.  The patient does perform self breast exams.  There is no notable family history of breast or ovarian cancer in her family.  The patient wears seatbelts: yes.   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 She occasionally goes to the gym. She has improved diet in association with taking Metformin  and Victoza  for T2 DM .    The patient denies current symptoms of depression.    Review of Systems: Review of Systems  Constitutional:  Negative for chills and fever.  HENT:  Negative for congestion, ear discharge, ear pain, hearing loss, sinus pain and sore throat.   Eyes:  Negative for blurred vision and double vision.  Respiratory:  Negative for cough, shortness of breath and wheezing.   Cardiovascular:  Negative for chest pain, palpitations and leg swelling.  Gastrointestinal:  Negative for abdominal pain, blood in stool, constipation, diarrhea, heartburn, melena, nausea and vomiting.  Genitourinary:  Negative for dysuria, flank pain, frequency, hematuria and urgency.       Positive for brown discharge  Musculoskeletal:  Negative for back pain, joint pain and myalgias.  Skin:  Negative for itching and rash.   Neurological:  Negative for dizziness, tingling, tremors, sensory change, speech change, focal weakness, seizures, loss of consciousness, weakness and headaches.  Endo/Heme/Allergies:  Negative for environmental allergies. Does not bruise/bleed easily.  Psychiatric/Behavioral:  Negative for depression, hallucinations, memory loss, substance abuse and suicidal ideas. The patient is not nervous/anxious and does not have insomnia.     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 No pertinent surgical history.  Gynecologic History:  Patient's last menstrual period was 04/07/2023 (exact date). Contraception: condoms Last Pap: 2022 Results were: no abnormalities   Obstetric History: G1P0010  Family History:  Family History  Problem Relation Age of Onset   Diabetes Mother     Social History:  Social History   Socioeconomic History   Marital status: Single    Spouse name: (boyfriend 3 years)   Number of children: Not on file   Years of education: 12   Highest education level: Not on file  Occupational History   Not on file  Tobacco Use   Smoking status: Never  Smokeless tobacco: Never   Tobacco comments:    tried cigarettes as a teenager  Vaping Use   Vaping status: Never Used  Substance and Sexual Activity   Alcohol use: Yes    Comment: wine occass 1-2 x a month, sometimes a beer   Drug use: Never   Sexual activity: Yes    Birth  control/protection: Condom, OCP  Other Topics Concern   Not on file  Social History Narrative   Lives with boyfriend    Social Drivers of Corporate Investment Banker Strain: Not on file  Food Insecurity: Not on file  Transportation Needs: Not on file  Physical Activity: Not on file  Stress: Not on file  Social Connections: Not on file  Intimate Partner Violence: Not on file    Allergies:  No Known Allergies  Medications: Prior to Admission medications   Medication Sig Start Date End Date Taking? Authorizing Provider  albuterol  (VENTOLIN  HFA) 108 (90 Base) MCG/ACT inhaler Inhale 2 puffs into the lungs every 6 (six) hours as needed for wheezing or shortness of breath (chest tightness, coughing fit). 05/17/19  Yes Tapia, Leisa, PA-C  escitalopram  (LEXAPRO ) 20 MG tablet Take 1 tablet (20 mg total) by mouth daily. 03/01/23  Yes Pender, Julie F, FNP  ketoconazole  (NIZORAL ) 2 % shampoo Apply 1 application topically 2 (two) times a week. Wash scalp 2 times weekly, let sit 5 minutes and rinse out 03/01/23  Yes Pender, Julie F, FNP  liraglutide  (VICTOZA ) 18 MG/3ML SOPN Inject 1.2 mg into the skin daily at 6 (six) AM. 03/01/23  Yes Pender, Julie F, FNP  metFORMIN  (GLUCOPHAGE ) 1000 MG tablet Take 1 tablet (1,000 mg total) by mouth 2 (two) times daily with a meal. 03/01/23  Yes Pender, Julie F, FNP  triamcinolone  cream (KENALOG ) 0.1 % Apply 1 application topically 2 (two) times daily as needed. For rash. Avoid applying to face, groin, and axilla. Use as directed. Liles-term use can cause thinning of the skin. 03/01/23  Yes Gareth Mliss FALCON, FNP  atorvastatin  (LIPITOR) 10 MG tablet Take 1 tablet (10 mg total) by mouth at bedtime. Patient not taking: Reported on 04/22/2023 05/29/21   Gareth Mliss FALCON, FNP  ISOtretinoin  (ABSORICA ) 40 MG capsule Take 1 capsule (40 mg total) by mouth daily. Take with food. Patient not taking: Reported on 04/22/2023 09/24/21   Jackquline Sawyer, MD  MILI 0.25-35 MG-MCG tablet TAKE 1  TABLET BY MOUTH DAILY Patient not taking: Reported on 04/22/2023 06/11/21   Lynda Bradley, CNM    Physical Exam Vitals: Blood pressure 130/74, pulse 78, weight 204 lb 6.4 oz (92.7 kg), last menstrual period 04/07/2023, unknown if currently breastfeeding.  General: NAD HEENT: normocephalic, anicteric Thyroid: no enlargement, no palpable nodules Pulmonary: No increased work of breathing, CTAB Cardiovascular: RRR, distal pulses 2+ Breast: Breast symmetrical, no tenderness, no palpable nodules or masses, no skin or nipple retraction present, no nipple discharge.  No axillary or supraclavicular lymphadenopathy. Abdomen: NABS, soft, non-tender, non-distended.  Umbilicus without lesions.  No hepatomegaly, splenomegaly or masses palpable. No evidence of hernia  Genitourinary:  External: Normal external female genitalia.  Normal urethral meatus, normal Bartholin's and Skene's glands.    Vagina: Normal vaginal mucosa, no evidence of prolapse.    Cervix: Grossly normal in appearance, no bleeding, no brown discharge  Uterus: Non-enlarged, mobile, normal contour.  No CMT  Adnexa: ovaries non-enlarged, no adnexal masses  Rectal: deferred  Lymphatic: no evidence of inguinal lymphadenopathy Extremities: no edema, erythema, or tenderness Neurologic: Grossly  intact Psychiatric: mood appropriate, affect full   Assessment: 33 y.o. G1P0010 routine annual exam  Plan: Problem List Items Addressed This Visit   None Visit Diagnoses       Well woman exam with routine gynecological exam    -  Primary   Relevant Orders   Cytology - PAP     Screening for cervical cancer       Relevant Orders   Cytology - PAP     Screen for sexually transmitted diseases       Relevant Orders   Cytology - PAP       1) STI screening  was offered and accepted  2)  ASCCP guidelines and rationale discussed.  Patient opts for every 3 years screening interval  3) Contraception - the patient is currently using  condoms.   She is happy with her current form of contraception and plans to continue  4) Routine healthcare maintenance including cholesterol, diabetes screening discussed managed by PCP  5) Recommend supplements: iron , calcium , vitamin D , continue with healthy diet and increased physical activity  6) Return in about 1 year (around 04/21/2024) for annual established gyn.   Slater Rains, CNM [facility] 04/22/2023 9:52 AM

## 2023-04-22 NOTE — Patient Instructions (Signed)
Vitamin D Deficiency Vitamin D deficiency is when your body does not have enough vitamin D. Vitamin D is important to your body because: It helps the body maintain calcium and phosphorus levels. These are important minerals. It plays a role in bone health. It reduces inflammation. It improves the body's defense system (immune system). If vitamin D deficiency is severe, it can cause a condition in which your bones become soft. In adults, this condition is called osteomalacia. In children, this condition is called rickets. What are the causes? This condition may be caused by: Not eating enough foods that contain vitamin D. Not getting enough natural sun exposure. Having certain digestive system diseases that make it difficult for your body to absorb vitamin D. These diseases include Crohn's disease, long-term (chronic) pancreatitis, and cystic fibrosis. Having had a surgery in which a part of the stomach or a part of the small intestine was removed. What increases the risk? You are more likely to develop this condition if you: Are an older adult. Do not spend much time outdoors. Live in a long-term care facility. Have dark skin. Take certain medicines, such as steroid medicines or certain seizure medicines. Are overweight or obese. Have chronic kidney or liver disease. What are the signs or symptoms? In mild cases of vitamin D deficiency, there may not be any symptoms. If the condition is severe, symptoms may include: Bone pain. Muscle pain. Not being able to walk normally (abnormal gait). Broken bones caused by a minor injury. Joint pain. How is this diagnosed? This condition may be diagnosed with blood tests. Imaging tests such as X-rays may also be done to look for changes in the bone. How is this treated? Treatment may include taking supplements as told by your health care provider. Your health care provider will tell you what dose is best for you. Supplements may include: Vitamin  D. Calcium. Follow these instructions at home: Eating and drinking Eat foods that contain vitamin D, such as: Dairy products, cereals, or juices that have vitamin D added to them (are fortified). Check the label. Fish, such as salmon or trout. Eggs. The vitamin D is in the yolk. Mushrooms that were treated with UV light. Beef liver. The items listed above may not be a complete list of foods and beverages you can eat and drink. Contact a dietitian for more information. General instructions Take over-the-counter and prescription medicines only as told by your health care provider. Take supplements only as told by your health care provider. Get regular, safe exposure to natural sunlight. Do not use a tanning bed. Maintain a healthy weight. Lose weight if needed. Keep all follow-up visits. This is important. How is this prevented? You can get vitamin D by: Eating foods that naturally contain vitamin D. Eating or drinking products that have been fortified with vitamin D, such as cereals, juices, and dairy products, including milk. Taking a vitamin D supplement or a multivitamin that contains vitamin D. Being in the sun. Your body naturally makes vitamin D when your skin is exposed to sunlight. Your body changes the sunlight into a form of the vitamin that it can use. Contact a health care provider if: Your symptoms do not go away. You feel nauseous or you vomit. You have fewer bowel movements than usual or are constipated. Summary Vitamin D deficiency is when your body does not have enough vitamin D. Vitamin D helps to keep your bones healthy. Vitamin D deficiency is primarily treated by taking supplements. Your health care  provider will suggest what dose is best for you. You can get vitamin D by eating foods that contain vitamin D, by being in the sun, and by taking a vitamin D supplement or a multivitamin that contains vitamin D. This information is not intended to replace advice given  to you by your health care provider. Make sure you discuss any questions you have with your health care provider. Document Revised: 01/03/2021 Document Reviewed: 01/03/2021 Elsevier Patient Education  2024 [employer].

## 2023-04-23 LAB — CYTOLOGY - PAP
Chlamydia: NEGATIVE
Comment: NEGATIVE
Comment: NEGATIVE
Comment: NEGATIVE
Comment: NORMAL
Diagnosis: NEGATIVE
High risk HPV: NEGATIVE
Neisseria Gonorrhea: NEGATIVE
Trichomonas: NEGATIVE

## 2023-04-25 ENCOUNTER — Encounter: Payer: Self-pay | Admitting: Advanced Practice Midwife

## 2023-05-09 ENCOUNTER — Other Ambulatory Visit: Payer: Self-pay

## 2023-05-10 ENCOUNTER — Other Ambulatory Visit: Payer: Self-pay

## 2023-05-14 ENCOUNTER — Other Ambulatory Visit: Payer: Self-pay

## 2023-05-20 ENCOUNTER — Other Ambulatory Visit: Payer: Self-pay

## 2023-05-20 MED ORDER — ONDANSETRON 4 MG PO TBDP
4.0000 mg | ORAL_TABLET | Freq: Three times a day (TID) | ORAL | 0 refills | Status: AC | PRN
  Filled 2023-05-20: qty 15, 5d supply, fill #0

## 2023-06-01 ENCOUNTER — Other Ambulatory Visit: Payer: Self-pay

## 2023-06-15 ENCOUNTER — Other Ambulatory Visit: Payer: Self-pay

## 2023-06-18 ENCOUNTER — Other Ambulatory Visit: Payer: Self-pay

## 2023-06-28 ENCOUNTER — Other Ambulatory Visit: Payer: Self-pay

## 2023-06-29 ENCOUNTER — Other Ambulatory Visit: Payer: Self-pay

## 2023-07-07 ENCOUNTER — Telehealth: Payer: Self-pay | Admitting: Nurse Practitioner

## 2023-07-07 NOTE — Telephone Encounter (Signed)
 Prior auth from [employer] or 0.5MG /DOS, (OZEMPIC, 0.25 OR 0.5 MG/DOSE,) 2 MG/1.5ML SOPN    Key: [id number]

## 2023-07-07 NOTE — Telephone Encounter (Signed)
 PA started through cover my meds

## 2023-07-09 ENCOUNTER — Other Ambulatory Visit: Payer: Self-pay

## 2023-07-12 ENCOUNTER — Other Ambulatory Visit: Payer: Self-pay

## 2023-07-21 NOTE — Progress Notes (Signed)
   There were no vitals taken for this visit.   Subjective:    Patient ID: Nicole Chambers, female    DOB: 1990/05/14, 33 y.o.   MRN: [id number]  HPI: Nicole Chambers is a 33 y.o. female  No chief complaint on file.   Discussed the use of AI scribe software for clinical note transcription with the patient, who gave verbal consent to proceed.  History of Present Illness          03/01/2023    3:16 PM 10/29/2022    3:36 PM 03/10/2022   12:30 PM  Depression screen PHQ 2/9  Decreased Interest 0 0 0  Down, Depressed, Hopeless 0 0 0  PHQ - 2 Score 0 0 0  Altered sleeping 0 0 0  Tired, decreased energy 2 0 0  Change in appetite 0 0 0  Feeling bad or failure about yourself  0 0 0  Trouble concentrating 1 0 0  Moving slowly or fidgety/restless 0 0 0  Suicidal thoughts  0 0  PHQ-9 Score 3 0 0  Difficult doing work/chores  Not difficult at all Not difficult at all    Relevant past medical, surgical, family and social history reviewed and updated as indicated. Interim medical history since our last visit reviewed. Allergies and medications reviewed and updated.  Review of Systems  Per HPI unless specifically indicated above     Objective:    There were no vitals taken for this visit.  {Vitals History (Optional):23777} Wt Readings from Last 3 Encounters:  04/22/23 204 lb 6.4 oz (92.7 kg)  03/01/23 207 lb 1.6 oz (93.9 kg)  10/29/22 207 lb 12.8 oz (94.3 kg)    Physical Exam Physical Exam    Results for orders placed or performed in visit on 04/22/23  Cytology - PAP   Collection Time: 04/22/23  9:08 AM  Result Value Ref Range   High risk HPV Negative    Neisseria Gonorrhea Negative    Chlamydia Negative    Trichomonas Negative    Adequacy      Satisfactory for evaluation; transformation zone component PRESENT.   Diagnosis      - Negative for intraepithelial lesion or malignancy (NILM)   Microorganisms Shift in flora suggestive of bacterial vaginosis    Comment Normal  Reference Range Trichomonas - Negative    Comment Normal Reference Range HPV - Negative    Comment Normal Reference Ranger Chlamydia - Negative    Comment      Normal Reference Range Neisseria Gonorrhea - Negative   {Labs (Optional):23779}    Assessment & Plan:   Problem List Items Addressed This Visit   None    Assessment and Plan Assessment & Plan         Follow up plan: No follow-ups on file.

## 2023-07-22 ENCOUNTER — Encounter: Payer: Self-pay | Admitting: Nurse Practitioner

## 2023-07-22 ENCOUNTER — Ambulatory Visit (INDEPENDENT_AMBULATORY_CARE_PROVIDER_SITE_OTHER): Admitting: Nurse Practitioner

## 2023-07-22 ENCOUNTER — Ambulatory Visit
Admission: RE | Admit: 2023-07-22 | Discharge: 2023-07-22 | Disposition: A | Discharge status: Home / Self Care | Source: Ambulatory Visit | Attending: Nurse Practitioner | Admitting: Nurse Practitioner

## 2023-07-22 ENCOUNTER — Other Ambulatory Visit (HOSPITAL_COMMUNITY)
Admission: RE | Admit: 2023-07-22 | Discharge: 2023-07-22 | Disposition: A | Discharge status: Home / Self Care | Source: Ambulatory Visit | Attending: Nurse Practitioner | Admitting: Nurse Practitioner

## 2023-07-22 VITALS — BP 122/70 | HR 100 | Resp 18 | Ht 72.0 in | Wt 205.3 lb

## 2023-07-22 DIAGNOSIS — M79662 Pain in left lower leg: Secondary | ICD-10-CM

## 2023-07-22 DIAGNOSIS — E1165 Type 2 diabetes mellitus with hyperglycemia: Secondary | ICD-10-CM | POA: Diagnosis not present

## 2023-07-22 DIAGNOSIS — N898 Other specified noninflammatory disorders of vagina: Secondary | ICD-10-CM

## 2023-07-22 DIAGNOSIS — Z7984 Long term (current) use of oral hypoglycemic drugs: Secondary | ICD-10-CM

## 2023-07-22 LAB — POCT GLYCOSYLATED HEMOGLOBIN (HGB A1C): Hemoglobin A1C: 8.4 % — AB (ref 4.0–5.6)

## 2023-07-22 MED ORDER — FREESTYLE LIBRE 3 PLUS SENSOR MISC: 6 refills | Status: DC

## 2023-07-22 MED ORDER — TIRZEPATIDE 2.5 MG/0.5ML ~~LOC~~ SOAJ: 2.5000 mg | SUBCUTANEOUS | Status: DC

## 2023-07-22 MED ORDER — FLUCONAZOLE 150 MG PO TABS: 150.0000 mg | ORAL_TABLET | ORAL | 0 refills | Status: DC | PRN

## 2023-07-23 ENCOUNTER — Other Ambulatory Visit: Payer: Self-pay

## 2023-07-23 ENCOUNTER — Telehealth: Payer: Self-pay | Admitting: Pharmacist

## 2023-07-23 NOTE — Progress Notes (Signed)
   Outreach Note  07/23/2023 Name: Nicole Chambers MRN: [id number] DOB: [birth year]  Referred by: Berniece Salines, FNP Reason for referral : Medication Assistance  Receive referral from PCP requesting outreach to patient related to the cost of her liraglutide (Victoza).  Was unable to reach patient via telephone today x 2 and have left HIPAA compliant voicemail asking patient to return my call.    Follow Up Plan: Will collaborate with Care Guide to outreach to schedule follow up with me  Estelle Grumbles, PharmD, [facility] [phone]

## 2023-07-26 ENCOUNTER — Encounter: Payer: Self-pay | Admitting: Pharmacist

## 2023-07-26 ENCOUNTER — Encounter: Payer: Self-pay | Admitting: Nurse Practitioner

## 2023-07-26 ENCOUNTER — Telehealth: Payer: Self-pay | Admitting: *Deleted

## 2023-07-26 NOTE — Telephone Encounter (Signed)
 Copied from CRM [id number]. Topic: General - Call Back - No Documentation >> Jul 22, 2023  1:54 PM Nicole Chambers wrote: Reason for CRM: Patient is calling back in regard to missed call, CAL advised it looks as though the pharmacist was reaching out to her but she did not know the number for call back. States patient should have a voicemail with a number to call. Patient notes she did not get a voicemail and wonders if someone could get the message over to them that she called back.

## 2023-07-26 NOTE — Progress Notes (Unsigned)
 Care Guide Pharmacy Note  07/26/2023 Name: Nicole Chambers MRN: [id number] DOB: 02-May-1990  Referred By: Quinton Buckler, FNP Reason for referral: Complex Care Management and Call Attempt #1 (Outreach to schedule referral with pharmacist )   Nicole Chambers is a 33 y.o. year old female who is a primary care patient of Quinton Buckler, FNP.  Nicole Chambers was referred to the pharmacist for assistance related to: DMII  An unsuccessful telephone outreach was attempted today to contact the patient who was referred to the pharmacy team for assistance with medication management. Additional attempts will be made to contact the patient.  Kandis Ormond, CMA [place]  [facility], [facility] Guide Direct Dial: [phone]  Fax: [phone] Website: [email or web address]

## 2023-07-27 ENCOUNTER — Telehealth: Payer: Self-pay

## 2023-07-27 LAB — CERVICOVAGINAL ANCILLARY ONLY
Bacterial Vaginitis (gardnerella): POSITIVE — AB
Candida Glabrata: NEGATIVE
Candida Vaginitis: POSITIVE — AB
Chlamydia: NEGATIVE
Comment: NEGATIVE
Comment: NEGATIVE
Comment: NEGATIVE
Comment: NEGATIVE
Comment: NEGATIVE
Comment: NORMAL
Neisseria Gonorrhea: NEGATIVE
Trichomonas: NEGATIVE

## 2023-07-27 NOTE — Telephone Encounter (Signed)
 Pt informed and will keep appt on 08/04/23

## 2023-07-27 NOTE — Telephone Encounter (Signed)
 Copied from CRM [id number]. Topic: General - Other >> Jul 27, 2023  1:45 PM Emylou G wrote: Reason for CRM: Last thursday she was seen.. needed drs note 4/8-4/14.Aaron Aas otherwise her job won't accept.Aaron Aas Pls call her [phone] so she can get updated note Would need this today.. asap.Aaron Aas

## 2023-07-27 NOTE — Telephone Encounter (Signed)
 Patient will need an appointment for extra dates if applied. PCP not in office

## 2023-07-27 NOTE — Progress Notes (Unsigned)
 Care Guide Pharmacy Note  07/27/2023 Name: Nicole Chambers MRN: [id number] DOB: 11-04-1990  Referred By: Quinton Buckler, FNP Reason for referral: Complex Care Management and Call Attempt #1 (Outreach to schedule referral with pharmacist )   Nicole Chambers is a 33 y.o. year old female who is a primary care patient of Quinton Buckler, FNP.  Nicole Chambers was referred to the pharmacist for assistance related to: DMII  A second unsuccessful telephone outreach was attempted today to contact the patient who was referred to the pharmacy team for assistance with medication assistance. Additional attempts will be made to contact the patient.  Nicole Chambers, CMA [place]  [facility], [facility] Guide Direct Dial: [phone]  Fax: [phone] Website: [email or web address]

## 2023-07-28 ENCOUNTER — Other Ambulatory Visit: Payer: Self-pay | Admitting: Pharmacist

## 2023-07-28 ENCOUNTER — Other Ambulatory Visit: Payer: Self-pay

## 2023-07-28 DIAGNOSIS — E1165 Type 2 diabetes mellitus with hyperglycemia: Secondary | ICD-10-CM

## 2023-07-28 NOTE — Progress Notes (Signed)
   07/28/2023 Name: Nicole Chambers MRN: [id number] DOB: Dec 20, 1990  Chief Complaint  Patient presents with   Medication Assistance    Isaura Schiller is a 33 y.o. year old female who presented for a telephone visit.   They were referred to the pharmacist by their PCP for assistance in managing medication access for Victoza.  Today patient shares that she has been unable to pick up Victoza from her pharmacy for months due to cost. Reports having prescription coverage through [facility] plan  Current medications:  - metformin 1000 mg twice daily - Mounjaro 2.5 mg weekly. Reports started from sample as provided by PCP last week  Current glucose readings: recalls last checked reading ~215   Assessment/Plan:   From review of formulary from [facility] plan website, generic Victoza (liraglutide) is covered as a tier 1 option through the plan  Outreach to [facility] on behalf of patient and speak with RPh Jessalyn who advises patient's [facility] Plan coverage is no longer active, but searches and finds alternative coverage for patient.  Liraglutide prescription goes through patient's Rx Advanced Prescription coverage for $5 copayment  Follow up with patient to provide update  Counsel patient that she may pick up liraglutide prescription and restart 1 week after last dose of Mounjaro.  Follow Up Plan: Clinical Pharmacist will follow up with patient by telephone on 09/01/2023 at 1:00 PM   Arthur Lash, PharmD, [facility] [phone]

## 2023-07-28 NOTE — Progress Notes (Signed)
 Care Guide Pharmacy Note  07/28/2023 Name: Nicole Chambers MRN: [id number] DOB: 12/23/1990  Referred By: Quinton Buckler, FNP Reason for referral: Complex Care Management and Call Attempt #1 (Outreach to schedule referral with pharmacist )   Nicole Chambers is a 33 y.o. year old female who is a primary care patient of Quinton Buckler, FNP.  Nicole Chambers was referred to the pharmacist for assistance related to: DMII  A third unsuccessful telephone outreach was attempted today to contact the patient who was referred to the pharmacy team for assistance with medication assistance. The Population Health team is pleased to engage with this patient at any time in the future upon receipt of referral and should he/she be interested in assistance from the Population Health team.  Nicole Chambers, CMA [facility]  [facility], [facility] Guide Direct Dial: [phone]  Fax: [phone] Website: [email or web address]

## 2023-07-28 NOTE — Progress Notes (Signed)
 Care Guide Pharmacy Note  07/28/2023 Name: Adaira Centola MRN: [id number] DOB: 05-17-1990  Referred By: Quinton Buckler, FNP Reason for referral: Complex Care Management and Call Attempt #1 (Outreach to schedule referral with pharmacist )   Marillyn Goren is a 33 y.o. year old female who is a primary care patient of Quinton Buckler, FNP.  Maree Hockley was referred to the pharmacist for assistance related to: DMII  Successful contact was made with the patient to discuss pharmacy services including being ready for the pharmacist to call at least 5 minutes before the scheduled appointment time and to have medication bottles and any blood pressure readings ready for review. The patient agreed to meet with the pharmacist via telephone visit on 07/28/2023  Kandis Ormond, CMA [place]  [facility], [facility] Guide Direct Dial: [phone]  Fax: [phone] Website: [email or web address]

## 2023-07-28 NOTE — Patient Instructions (Signed)
 Goals Addressed             This Visit's Progress    Pharmacy Goals       Please follow up with [facility] regarding picking up your liraglutide prescription  [facility] Building [address] [place], [place] [address] [phone]  The goal A1c is less than 7%. This is the best way to reduce the risk of the [place] term complications of diabetes, including heart disease, kidney disease, eye disease, strokes, and nerve damage. An A1c of less than 7% corresponds with fasting sugars less than 130 and 2 hour after meal sugars less than 180.   Arthur Lash, PharmD, [facility] [phone]

## 2023-07-29 ENCOUNTER — Other Ambulatory Visit: Payer: Self-pay

## 2023-07-30 ENCOUNTER — Other Ambulatory Visit: Payer: Self-pay

## 2023-07-30 ENCOUNTER — Other Ambulatory Visit: Payer: Self-pay | Admitting: Internal Medicine

## 2023-07-30 ENCOUNTER — Other Ambulatory Visit: Payer: Self-pay | Admitting: Pharmacist

## 2023-07-30 DIAGNOSIS — B9689 Other specified bacterial agents as the cause of diseases classified elsewhere: Secondary | ICD-10-CM

## 2023-07-30 MED ORDER — METRONIDAZOLE 500 MG PO TABS: 500.0000 mg | ORAL_TABLET | Freq: Two times a day (BID) | ORAL | 0 refills | Status: AC

## 2023-07-30 MED ORDER — PEN NEEDLES 32G X 6 MM MISC
1 refills | Status: DC
  Filled 2023-07-30: qty 100, 90d supply, fill #0
  Filled 2023-08-26: qty 100, 100d supply, fill #0
  Filled 2023-10-01: qty 100, 90d supply, fill #0

## 2023-07-30 NOTE — Progress Notes (Signed)
 Received a message from [facility] requesting prescription for pen needles for patient to use with her liraglutide  (Victoza ) pen.   Would you please send prescription to [facility] for patient?  Thank you!  Arthur Lash, PharmD, [facility] [phone]

## 2023-08-02 ENCOUNTER — Other Ambulatory Visit: Payer: Self-pay

## 2023-08-04 ENCOUNTER — Ambulatory Visit: Admitting: Nurse Practitioner

## 2023-08-04 NOTE — Telephone Encounter (Unsigned)
 Copied from CRM [id number]. Topic: General - Other >> Jul 27, 2023  1:45 PM Emylou G wrote: Reason for CRM: Last thursday she was seen.. needed drs note 4/8-4/14.Aaron Aas otherwise her job won't accept.Aaron Aas Pls call her [phone] so she can get updated note Would need this today.. asap.. >> Aug 04, 2023  2:53 PM Fredrica W wrote: Patient called she missed appt for today but reschedule for tomorrow. Wanted to see if Concha Deed could fix her work note before she goes back to work [employer]. It should be dated for 4/7-4/14. Please contact patient if this is able to be done. Thank You

## 2023-08-04 NOTE — Telephone Encounter (Signed)
 The patient called back to check on the status of her work note. I spoke with Cassandra and the provider has already left for the day and it hasn't been edited yet. I will tell the patient and let her know she can get it at her appt tomorrow morning with her provider

## 2023-08-04 NOTE — Progress Notes (Deleted)
   LMP 06/29/2023    Subjective:    Patient ID: Nicole Chambers, female    DOB: [birth year], 33 y.o.   MRN: [id number]  HPI: Nicole Chambers is a 33 y.o. female  No chief complaint on file.   Discussed the use of AI scribe software for clinical note transcription with the patient, who gave verbal consent to proceed.  History of Present Illness          07/22/2023    8:11 AM 03/01/2023    3:16 PM 10/29/2022    3:36 PM  Depression screen PHQ 2/9  Decreased Interest 0 0 0  Down, Depressed, Hopeless 0 0 0  PHQ - 2 Score 0 0 0  Altered sleeping 0 0 0  Tired, decreased energy 2 2 0  Change in appetite 0 0 0  Feeling bad or failure about yourself  0 0 0  Trouble concentrating 0 1 0  Moving slowly or fidgety/restless 0 0 0  Suicidal thoughts 0  0  PHQ-9 Score 2 3 0  Difficult doing work/chores Not difficult at all  Not difficult at all    Relevant past medical, surgical, family and social history reviewed and updated as indicated. Interim medical history since our last visit reviewed. Allergies and medications reviewed and updated.  Review of Systems  Per HPI unless specifically indicated above     Objective:    LMP 06/29/2023   {Vitals History (Optional):23777} Wt Readings from Last 3 Encounters:  07/22/23 205 lb 4.8 oz (93.1 kg)  04/22/23 204 lb 6.4 oz (92.7 kg)  03/01/23 207 lb 1.6 oz (93.9 kg)    Physical Exam Physical Exam    Results for orders placed or performed in visit on 07/22/23  Cervicovaginal ancillary only   Collection Time: 07/22/23  8:32 AM  Result Value Ref Range   Neisseria Gonorrhea Negative    Chlamydia Negative    Trichomonas Negative    Bacterial Vaginitis (gardnerella) Positive (A)    Candida Vaginitis Positive (A)    Candida Glabrata Negative    Comment Normal Reference Range Candida Species - Negative    Comment Normal Reference Range Candida Galbrata - Negative    Comment Normal Reference Range Trichomonas - Negative    Comment Normal  Reference Ranger Chlamydia - Negative    Comment      Normal Reference Range Neisseria Gonorrhea - Negative   Comment      Normal Reference Range Bacterial Vaginosis - Negative  POCT HgB A1C   Collection Time: 07/22/23  8:55 AM  Result Value Ref Range   Hemoglobin A1C 8.4 (A) 4.0 - 5.6 %   HbA1c POC (<> result, manual entry)     HbA1c, POC (prediabetic range)     HbA1c, POC (controlled diabetic range)     {Labs (Optional):23779}    Assessment & Plan:   Problem List Items Addressed This Visit   None    Assessment and Plan Assessment & Plan         Follow up plan: No follow-ups on file.

## 2023-08-04 NOTE — Progress Notes (Addendum)
   LMP 06/29/2023    Subjective:    Patient ID: Nicole Chambers, female    DOB: Nov 15, 1990, 33 y.o.   MRN: [id number]  HPI: Nicole Chambers is a 33 y.o. female  No chief complaint on file.   Discussed the use of AI scribe software for clinical note transcription with the patient, who gave verbal consent to proceed.  History of Present Illness          07/22/2023    8:11 AM 03/01/2023    3:16 PM 10/29/2022    3:36 PM  Depression screen PHQ 2/9  Decreased Interest 0 0 0  Down, Depressed, Hopeless 0 0 0  PHQ - 2 Score 0 0 0  Altered sleeping 0 0 0  Tired, decreased energy 2 2 0  Change in appetite 0 0 0  Feeling bad or failure about yourself  0 0 0  Trouble concentrating 0 1 0  Moving slowly or fidgety/restless 0 0 0  Suicidal thoughts 0  0  PHQ-9 Score 2 3 0  Difficult doing work/chores Not difficult at all  Not difficult at all    Relevant past medical, surgical, family and social history reviewed and updated as indicated. Interim medical history since our last visit reviewed. Allergies and medications reviewed and updated.  Review of Systems  Per HPI unless specifically indicated above     Objective:    LMP 06/29/2023   {Vitals History (Optional):23777} Wt Readings from Last 3 Encounters:  07/22/23 205 lb 4.8 oz (93.1 kg)  04/22/23 204 lb 6.4 oz (92.7 kg)  03/01/23 207 lb 1.6 oz (93.9 kg)    Physical Exam Physical Exam    Results for orders placed or performed in visit on 07/22/23  Cervicovaginal ancillary only   Collection Time: 07/22/23  8:32 AM  Result Value Ref Range   Neisseria Gonorrhea Negative    Chlamydia Negative    Trichomonas Negative    Bacterial Vaginitis (gardnerella) Positive (A)    Candida Vaginitis Positive (A)    Candida Glabrata Negative    Comment Normal Reference Range Candida Species - Negative    Comment Normal Reference Range Candida Galbrata - Negative    Comment Normal Reference Range Trichomonas - Negative    Comment Normal  Reference Ranger Chlamydia - Negative    Comment      Normal Reference Range Neisseria Gonorrhea - Negative   Comment      Normal Reference Range Bacterial Vaginosis - Negative  POCT HgB A1C   Collection Time: 07/22/23  8:55 AM  Result Value Ref Range   Hemoglobin A1C 8.4 (A) 4.0 - 5.6 %   HbA1c POC (<> result, manual entry)     HbA1c, POC (prediabetic range)     HbA1c, POC (controlled diabetic range)     {Labs (Optional):23779}    Assessment & Plan:   Problem List Items Addressed This Visit   None    Assessment and Plan Assessment & Plan         Follow up plan: No follow-ups on file.

## 2023-08-05 ENCOUNTER — Encounter: Payer: Self-pay | Admitting: Nurse Practitioner

## 2023-08-05 ENCOUNTER — Ambulatory Visit: Admitting: Nurse Practitioner

## 2023-08-05 VITALS — BP 124/82 | HR 83 | Temp 98.2°F | Resp 18 | Ht 72.0 in | Wt 199.1 lb

## 2023-08-05 DIAGNOSIS — Z0289 Encounter for other administrative examinations: Secondary | ICD-10-CM

## 2023-08-05 DIAGNOSIS — E1165 Type 2 diabetes mellitus with hyperglycemia: Secondary | ICD-10-CM | POA: Diagnosis not present

## 2023-08-05 DIAGNOSIS — G629 Polyneuropathy, unspecified: Secondary | ICD-10-CM | POA: Diagnosis not present

## 2023-08-05 DIAGNOSIS — Z7985 Long-term (current) use of injectable non-insulin antidiabetic drugs: Secondary | ICD-10-CM | POA: Diagnosis not present

## 2023-08-12 ENCOUNTER — Other Ambulatory Visit: Payer: Self-pay

## 2023-08-18 ENCOUNTER — Ambulatory Visit: Admitting: Dermatology

## 2023-08-18 ENCOUNTER — Other Ambulatory Visit: Payer: Self-pay | Admitting: Nurse Practitioner

## 2023-08-18 DIAGNOSIS — N898 Other specified noninflammatory disorders of vagina: Secondary | ICD-10-CM

## 2023-08-20 ENCOUNTER — Other Ambulatory Visit: Payer: Self-pay | Admitting: Nurse Practitioner

## 2023-08-20 DIAGNOSIS — N898 Other specified noninflammatory disorders of vagina: Secondary | ICD-10-CM

## 2023-08-23 ENCOUNTER — Ambulatory Visit: Admitting: Dermatology

## 2023-08-24 ENCOUNTER — Ambulatory Visit: Admitting: Dermatology

## 2023-08-26 ENCOUNTER — Other Ambulatory Visit: Payer: Self-pay

## 2023-08-27 ENCOUNTER — Other Ambulatory Visit: Payer: Self-pay

## 2023-08-30 ENCOUNTER — Other Ambulatory Visit: Payer: Self-pay

## 2023-08-31 ENCOUNTER — Other Ambulatory Visit: Payer: Self-pay

## 2023-09-01 ENCOUNTER — Other Ambulatory Visit: Payer: Self-pay | Admitting: Nurse Practitioner

## 2023-09-01 ENCOUNTER — Other Ambulatory Visit: Payer: Self-pay

## 2023-09-01 ENCOUNTER — Other Ambulatory Visit: Payer: Self-pay | Admitting: Pharmacist

## 2023-09-01 DIAGNOSIS — E1165 Type 2 diabetes mellitus with hyperglycemia: Secondary | ICD-10-CM

## 2023-09-01 MED ORDER — LIRAGLUTIDE 18 MG/3ML ~~LOC~~ SOPN
1.8000 mg | PEN_INJECTOR | Freq: Every day | SUBCUTANEOUS | 5 refills | Status: DC
  Filled 2023-09-01 – 2023-10-01 (×4): qty 6, 20d supply, fill #0
  Filled 2023-10-16 – 2023-10-18 (×2): qty 6, 20d supply, fill #1
  Filled 2023-10-18: qty 9, 30d supply, fill #1

## 2023-09-01 NOTE — Progress Notes (Signed)
 09/01/2023 Name: Nicole Chambers MRN: [id number] DOB: Aug 31, 1990  Chief Complaint  Patient presents with   Medication Management    Nicole Chambers is a 33 y.o. year old female who presented for a telephone visit.   They were referred to the pharmacist by their PCP for assistance in managing diabetes and medication access.    Subjective:  Care Team: Primary Care Provider: Quinton Buckler, FNP    Medication Access/Adherence  Current Pharmacy:  [id number] [place], [place] - [address] AT [facility] [address] [facility] [address] [place] [address] Phone: [phone] Fax: [phone]  [facility] [address] [place] [address] Phone: [phone] Fax: [phone]  [facility] [place], [place] - [address] [place] [address] Phone: [phone] Fax: [phone]   Patient reports affordability concerns with their medications: No  Patient reports access/transportation concerns to their pharmacy: No  Patient reports adherence concerns with their medications:  No     Diabetes:  Current medications:  - metformin  1000 mg twice daily - liraglutide  1.2 mg daily Reports tolerating well. Admits to having some mild constipation, but attributes in part to not staying well hydrated  Current glucose readings ranging: 115-220 Reports was previously using Freestyle Libre 3 Plus continuous glucose monitoring, but not currently wearing a sensor. Plans to restart CGM as picked up sensor refill  Patient denies hypoglycemic s/sx including dizziness, shakiness, sweating.   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 Sig Documenting Provider Last Dose Status Informant  albuterol  (VENTOLIN  HFA) 108 (90 Base) MCG/ACT inhaler [id number]  Inhale 2 puffs into the lungs every 6 (six) hours as needed for wheezing or shortness of breath (chest tightness, coughing fit). Tapia, Leisa, PA-C  Active   Continuous Glucose Sensor (FREESTYLE LIBRE 3 PLUS SENSOR) MISC [id number]  Change sensor every 15 days. Quinton Buckler, FNP  Active   escitalopram  (LEXAPRO ) 20 MG tablet [id number]  Take 1 tablet (20 mg total) by mouth daily. Quinton Buckler, FNP  Active   fluconazole  (DIFLUCAN ) 150 MG tablet [id number]  Take 1 tablet (150 mg total) by mouth every 3 (three) days as needed (for vaginal itching/yeast infection sx). Pender, Julie F, FNP  Active   gabapentin (NEURONTIN) 100 MG capsule [id number]  Take 100 mg by mouth 3 (three) times daily. [provider]  Active   Insulin  Pen Needle (PEN NEEDLES) 32G X 6 MM MISC [id number]  Use daily with liraglutide  Pender, Julie F, FNP  Active   ketoconazole  (NIZORAL )  2 % shampoo [id number]  Apply 1 application topically 2 (two) times a week. Wash scalp 2 times weekly, let sit 5 minutes and rinse out Pender, Julie F, FNP  Active   liraglutide  (VICTOZA ) 18 MG/3ML SOPN [id number]  Inject 1.2 mg into the skin daily at 6 (six) AM. Pender, Julie F, FNP  Active   metFORMIN  (GLUCOPHAGE ) 1000 MG tablet [id number]  Take 1 tablet (1,000 mg total) by mouth 2 (two) times daily with a meal. Quinton Buckler, FNP  Active   ondansetron  (ZOFRAN -ODT) 4 MG disintegrating tablet [id number]  Take 1 tablet (4 mg total) by mouth every 8 (eight) hours as needed for nausea for up to 7 days   Active   triamcinolone  cream (KENALOG ) 0.1 % [id number]  Apply 1 application topically 2 (two) times daily as needed. For rash. Avoid applying to face, groin, and axilla. Use as directed. Vallely-term use can cause thinning of the skin. Quinton Buckler, FNP  Active               Assessment/Plan:   Diabetes: - Reviewed goal A1c, goal fasting, and goal 2 hour post prandial glucose - Reviewed dietary modifications including importance of having regular well-balanced meals and snacks throughout the day, while controlling carbohydrate portion sizes             Discuss balanced snack ideas             Encourage patient to increase consumption of non-starchy vegetables  Encourage patient to stay well hydrated throughout the day - Collaborate with PCP regarding request from patient to increase liraglutide  to 1.8 mg daily   Prescriber sends new order to pharmacy for patient - Collaborate with PCP to request referral to dietitian - Recommend to continue to use Freestyle Libre 3 CGM to monitor blood sugar/as feedback on dietary choices Recommend to check glucose with fingerstick check when needed for symptoms and as back up to CGM.  Patient to contact office if needed for readings outside of established parameters or symptoms   Follow Up Plan: Clinical Pharmacist will follow up with patient by telephone on 10/06/2023 at 2:30 PM   Arthur Lash, PharmD, [facility] [phone]

## 2023-09-01 NOTE — Patient Instructions (Signed)
 Goals Addressed             This Visit's Progress    Pharmacy Goals       Please follow up with [facility] regarding picking up your liraglutide prescription  [facility] Building [address] [place], [place] [address] [phone]  The goal A1c is less than 7%. This is the best way to reduce the risk of the [place] term complications of diabetes, including heart disease, kidney disease, eye disease, strokes, and nerve damage. An A1c of less than 7% corresponds with fasting sugars less than 130 and 2 hour after meal sugars less than 180.   Arthur Lash, PharmD, [facility] [phone]

## 2023-09-09 ENCOUNTER — Other Ambulatory Visit: Payer: Self-pay

## 2023-09-14 ENCOUNTER — Other Ambulatory Visit: Payer: Self-pay

## 2023-09-15 ENCOUNTER — Ambulatory Visit: Admitting: Dermatology

## 2023-09-20 ENCOUNTER — Other Ambulatory Visit: Payer: Self-pay

## 2023-10-01 ENCOUNTER — Other Ambulatory Visit: Payer: Self-pay

## 2023-10-06 ENCOUNTER — Other Ambulatory Visit: Payer: Self-pay | Admitting: Pharmacist

## 2023-10-06 ENCOUNTER — Telehealth: Payer: Self-pay | Admitting: Pharmacist

## 2023-10-06 NOTE — Progress Notes (Unsigned)
   Outreach Note  10/06/2023 Name: Nicole Chambers MRN: [id number] DOB: 08-Jun-1990  Referred by: Gareth Mliss FALCON, FNP  Was unable to reach patient via telephone today and have left HIPAA compliant voicemail asking patient to return my call.    Follow Up Plan: Will collaborate with Care Guide to outreach to schedule follow up with me  Sharyle Sia, PharmD, [facility] [phone]

## 2023-10-17 ENCOUNTER — Other Ambulatory Visit: Payer: Self-pay

## 2023-10-18 ENCOUNTER — Other Ambulatory Visit: Payer: Self-pay

## 2023-10-19 ENCOUNTER — Other Ambulatory Visit: Payer: Self-pay | Admitting: Nurse Practitioner

## 2023-10-19 ENCOUNTER — Other Ambulatory Visit: Payer: Self-pay

## 2023-10-19 DIAGNOSIS — E1165 Type 2 diabetes mellitus with hyperglycemia: Secondary | ICD-10-CM

## 2023-10-19 MED ORDER — LIRAGLUTIDE 18 MG/3ML ~~LOC~~ SOPN
1.8000 mg | PEN_INJECTOR | Freq: Every day | SUBCUTANEOUS | 0 refills | Status: DC
  Filled 2023-10-19 – 2023-10-20 (×2): qty 27, 90d supply, fill #0
  Filled 2023-10-22: qty 9, 30d supply, fill #0
  Filled 2023-10-26: qty 27, 90d supply, fill #0

## 2023-10-20 ENCOUNTER — Other Ambulatory Visit: Payer: Self-pay

## 2023-10-22 ENCOUNTER — Other Ambulatory Visit: Payer: Self-pay

## 2023-10-26 ENCOUNTER — Other Ambulatory Visit: Payer: Self-pay

## 2023-11-17 ENCOUNTER — Ambulatory Visit: Admitting: Nurse Practitioner

## 2023-11-26 ENCOUNTER — Ambulatory Visit: Attending: Nurse Practitioner

## 2023-11-26 ENCOUNTER — Telehealth: Payer: Self-pay | Admitting: Sleep Medicine

## 2023-11-26 ENCOUNTER — Other Ambulatory Visit: Payer: Self-pay

## 2023-11-26 ENCOUNTER — Ambulatory Visit: Admitting: Nurse Practitioner

## 2023-11-26 ENCOUNTER — Encounter: Payer: Self-pay | Admitting: Nurse Practitioner

## 2023-11-26 VITALS — BP 112/84 | HR 74 | Temp 98.0°F | Resp 18 | Ht 72.0 in | Wt 200.6 lb

## 2023-11-26 DIAGNOSIS — E1165 Type 2 diabetes mellitus with hyperglycemia: Secondary | ICD-10-CM

## 2023-11-26 DIAGNOSIS — R5383 Other fatigue: Secondary | ICD-10-CM | POA: Diagnosis not present

## 2023-11-26 DIAGNOSIS — R0683 Snoring: Secondary | ICD-10-CM | POA: Diagnosis not present

## 2023-11-26 DIAGNOSIS — R0602 Shortness of breath: Secondary | ICD-10-CM

## 2023-11-26 DIAGNOSIS — R002 Palpitations: Secondary | ICD-10-CM

## 2023-11-26 DIAGNOSIS — Z7984 Long term (current) use of oral hypoglycemic drugs: Secondary | ICD-10-CM

## 2023-11-26 NOTE — Progress Notes (Signed)
 BP 112/84   Pulse 74   Temp 98 F (36.7 C) (Oral)   Resp 18   Ht 6' (1.829 m)   Wt 200 lb 9.6 oz (91 kg)   SpO2 97%   BMI 27.21 kg/m    Subjective:    Patient ID: Nicole Chambers, female    DOB: 01-29-1991, 33 y.o.   MRN: [id number]  HPI: Nicole Chambers is a 33 y.o. female  Chief Complaint  Patient presents with   Fatigue    Discussed the use of AI scribe software for clinical note transcription with the patient, who gave verbal consent to proceed.  History of Present Illness Nicole Chambers is a 33 year old female with type 2 diabetes who presents with fatigue.  Fatigue and decreased exercise tolerance - Significant fatigue with lack of energy impacting ability to perform daily activities - Climbing stairs and taking a shower are particularly exhausting  Cardiorespiratory symptoms - Occasional shortness of breath - Palpitations occurred twice  Sleep disturbance - Works night shift, making it difficult to maintain regular sleep schedule - Snoring during sleep - Subjective sense that sleep may be adequate  Glycemic control and diabetes management - Type 2 diabetes managed with metformin  1000 mg twice daily and Victoza  1.8 mg daily - Last hemoglobin A1c in April was 8.4% - Blood glucose levels have been stable since medication dosage increase - Increased appetite recently  Mood and anxiety symptoms - On Lexapro  20 mg daily for depression and anxiety          07/22/2023    8:11 AM 03/01/2023    3:16 PM 10/29/2022    3:36 PM  Depression screen PHQ 2/9  Decreased Interest 0 0 0  Down, Depressed, Hopeless 0 0 0  PHQ - 2 Score 0 0 0  Altered sleeping 0 0 0  Tired, decreased energy 2 2 0  Change in appetite 0 0 0  Feeling bad or failure about yourself  0 0 0  Trouble concentrating 0 1 0  Moving slowly or fidgety/restless 0 0 0  Suicidal thoughts 0  0  PHQ-9 Score 2 3 0  Difficult doing work/chores Not difficult at all  Not difficult at all    Relevant past  medical, surgical, family and social history reviewed and updated as indicated. Interim medical history since our last visit reviewed. Allergies and medications reviewed and updated.  Review of Systems  Ten systems reviewed and is negative except as mentioned in HPI      Objective:     BP 112/84   Pulse 74   Temp 98 F (36.7 C) (Oral)   Resp 18   Ht 6' (1.829 m)   Wt 200 lb 9.6 oz (91 kg)   SpO2 97%   BMI 27.21 kg/m    Wt Readings from Last 3 Encounters:  11/26/23 200 lb 9.6 oz (91 kg)  08/05/23 199 lb 1.6 oz (90.3 kg)  07/22/23 205 lb 4.8 oz (93.1 kg)    Physical Exam Physical Exam GENERAL: Alert, cooperative, well developed, no acute distress HEENT: Normocephalic, normal oropharynx, moist mucous membranes CHEST: Clear to auscultation bilaterally, No wheezes, rhonchi, or crackles CARDIOVASCULAR: Normal heart rate and rhythm, S1 and S2 normal without murmurs ABDOMEN: Soft, non-tender, non-distended, without organomegaly, Normal bowel sounds EXTREMITIES: No cyanosis or edema NEUROLOGICAL: Cranial nerves grossly intact, Moves all extremities without gross motor or sensory deficit   Results for orders placed or performed in visit on 07/22/23  Cervicovaginal ancillary only  Collection Time: 07/22/23  8:32 AM  Result Value Ref Range   Neisseria Gonorrhea Negative    Chlamydia Negative    Trichomonas Negative    Bacterial Vaginitis (gardnerella) Positive (A)    Candida Vaginitis Positive (A)    Candida Glabrata Negative    Comment Normal Reference Range Candida Species - Negative    Comment Normal Reference Range Candida Galbrata - Negative    Comment Normal Reference Range Trichomonas - Negative    Comment Normal Reference Ranger Chlamydia - Negative    Comment      Normal Reference Range Neisseria Gonorrhea - Negative   Comment      Normal Reference Range Bacterial Vaginosis - Negative  POCT HgB A1C   Collection Time: 07/22/23  8:55 AM  Result Value Ref Range    Hemoglobin A1C 8.4 (A) 4.0 - 5.6 %   HbA1c POC (<> result, manual entry)     HbA1c, POC (prediabetic range)     HbA1c, POC (controlled diabetic range)            Assessment & Plan:   Problem List Items Addressed This Visit       Endocrine   Uncontrolled type 2 diabetes mellitus with hyperglycemia (HCC)   Other Visit Diagnoses       Other fatigue    -  Primary   Relevant Orders   CBC with Differential/Platelet   Comprehensive metabolic panel with GFR   Hemoglobin A1c   TSH   VITAMIN D  25 Hydroxy (Vit-D Deficiency, Fractures)   B12 and Folate Panel   Iron , TIBC and Ferritin Panel   Ambulatory referral to Pulmonology     Exertional shortness of breath       Relevant Orders   CBC with Differential/Platelet   Comprehensive metabolic panel with GFR   TSH   EKG 12-Lead   Pagel TERM MONITOR (3-14 DAYS)     Palpitations       Relevant Orders   CBC with Differential/Platelet   Comprehensive metabolic panel with GFR   TSH   EKG 12-Lead   Icard TERM MONITOR (3-14 DAYS)     Snores       Relevant Orders   Ambulatory referral to Pulmonology        Assessment and Plan Assessment & Plan Fatigue Complains of fatigue with no energy for daily activities, feeling tired after climbing stairs and showering. Fatigue predates recent medication changes. Differential diagnosis includes vitamin deficiencies, thyroid dysfunction, and sleep apnea. - Order CBC, CMP, A1c, B12 and folate, vitamin D , TSH, and iron  panel to evaluate potential causes. - Order sleep study to rule out sleep apnea.  Type 2 diabetes mellitus Type 2 diabetes mellitus with last A1c in April at 8.4. Currently on metformin  and Victoza  with reported well-controlled blood sugar since dosage adjustment. - Recheck A1c to assess current glycemic control.  Palpitations Reports palpitations approximately twice. Differential diagnosis includes cardiac arrhythmias. EKG and Zio patch will assess heart rhythm and activity. -  Order EKG to assess current heart rhythm. - Order Zio patch for continuous heart monitoring over two weeks.  Exertional shortness of breath Experiences shortness of breath with exertion, such as climbing stairs. Differential diagnosis includes cardiac issues and sleep apnea. EKG, Zio patch, and sleep study will evaluate cardiac function and sleep apnea. - Order EKG to assess cardiac function. - Order Zio patch for continuous heart monitoring over two weeks. - Order sleep study to evaluate for sleep apnea.  Snores Reports snoring during  sleep, possibly indicating sleep apnea. Sleep study will evaluate for sleep apnea. - Order sleep study to evaluate for sleep apnea.        Follow up plan: Return if symptoms worsen or fail to improve.

## 2023-11-26 NOTE — Telephone Encounter (Signed)
 Sent mychart message to schedule sleep consult.

## 2023-11-29 ENCOUNTER — Other Ambulatory Visit: Payer: Self-pay

## 2023-11-29 ENCOUNTER — Ambulatory Visit: Payer: Self-pay | Admitting: Nurse Practitioner

## 2023-11-29 ENCOUNTER — Other Ambulatory Visit: Payer: Self-pay | Admitting: Nurse Practitioner

## 2023-11-29 DIAGNOSIS — E611 Iron deficiency: Secondary | ICD-10-CM

## 2023-11-29 DIAGNOSIS — R7989 Other specified abnormal findings of blood chemistry: Secondary | ICD-10-CM

## 2023-11-29 MED ORDER — IRON (FERROUS SULFATE) 325 (65 FE) MG PO TABS
1.0000 | ORAL_TABLET | Freq: Every day | ORAL | 1 refills | Status: AC
  Filled 2023-11-29: qty 90, 90d supply, fill #0
  Filled 2024-03-06: qty 90, 90d supply, fill #1

## 2023-12-01 LAB — TEST AUTHORIZATION 3

## 2023-12-01 LAB — CBC WITH DIFFERENTIAL/PLATELET
Absolute Lymphocytes: 2495 {cells}/uL (ref 850–3900)
Absolute Monocytes: 271 {cells}/uL (ref 200–950)
Basophils Absolute: 32 {cells}/uL (ref 0–200)
Basophils Relative: 0.5 %
Eosinophils Absolute: 151 {cells}/uL (ref 15–500)
Eosinophils Relative: 2.4 %
HCT: 31 % — ABNORMAL LOW (ref 35.0–45.0)
Hemoglobin: 9.7 g/dL — ABNORMAL LOW (ref 11.7–15.5)
MCH: 27.6 pg (ref 27.0–33.0)
MCHC: 31.3 g/dL — ABNORMAL LOW (ref 32.0–36.0)
MCV: 88.3 fL (ref 80.0–100.0)
MPV: 10.1 fL (ref 7.5–12.5)
Monocytes Relative: 4.3 %
Neutro Abs: 3352 {cells}/uL (ref 1500–7800)
Neutrophils Relative %: 53.2 %
Platelets: 395 Thousand/uL (ref 140–400)
RBC: 3.51 Million/uL — ABNORMAL LOW (ref 3.80–5.10)
RDW: 15.9 % — ABNORMAL HIGH (ref 11.0–15.0)
Total Lymphocyte: 39.6 %
WBC: 6.3 Thousand/uL (ref 3.8–10.8)

## 2023-12-01 LAB — B12 AND FOLATE PANEL
Folate: 10.3 ng/mL
Vitamin B-12: 409 pg/mL (ref 200–1100)

## 2023-12-01 LAB — COMPREHENSIVE METABOLIC PANEL WITH GFR
AG Ratio: 1.7 (calc) (ref 1.0–2.5)
ALT: 10 U/L (ref 6–29)
AST: 10 U/L (ref 10–30)
Albumin: 4.4 g/dL (ref 3.6–5.1)
Alkaline phosphatase (APISO): 58 U/L (ref 31–125)
BUN: 11 mg/dL (ref 7–25)
CO2: 26 mmol/L (ref 20–32)
Calcium: 9.2 mg/dL (ref 8.6–10.2)
Chloride: 103 mmol/L (ref 98–110)
Creat: 0.65 mg/dL (ref 0.50–0.97)
Globulin: 2.6 g/dL (ref 1.9–3.7)
Glucose, Bld: 140 mg/dL — ABNORMAL HIGH (ref 65–99)
Potassium: 4.2 mmol/L (ref 3.5–5.3)
Sodium: 138 mmol/L (ref 135–146)
Total Bilirubin: 0.2 mg/dL (ref 0.2–1.2)
Total Protein: 7 g/dL (ref 6.1–8.1)
eGFR: 121 mL/min/1.73m2 (ref >=60)

## 2023-12-01 LAB — IRON,TIBC AND FERRITIN PANEL
%SAT: 4 % — ABNORMAL LOW (ref 16–45)
Ferritin: 5 ng/mL — ABNORMAL LOW (ref 16–154)
Iron: 16 ug/dL — ABNORMAL LOW (ref 40–190)
TIBC: 414 ug/dL (ref 250–450)

## 2023-12-01 LAB — T4, FREE: Free T4: 1.1 ng/dL (ref 0.8–1.8)

## 2023-12-01 LAB — HEMOGLOBIN A1C
Hgb A1c MFr Bld: 7.5 % — ABNORMAL HIGH (ref <5.7)
Mean Plasma Glucose: 169 mg/dL
eAG (mmol/L): 9.3 mmol/L

## 2023-12-01 LAB — VITAMIN D 25 HYDROXY (VIT D DEFICIENCY, FRACTURES): Vit D, 25-Hydroxy: 25 ng/mL — ABNORMAL LOW (ref 30–100)

## 2023-12-01 LAB — TEST AUTHORIZATION 2

## 2023-12-01 LAB — TSH: TSH: 0.39 m[IU]/L — ABNORMAL LOW

## 2023-12-01 LAB — T3, FREE: T3, Free: 2.8 pg/mL (ref 2.3–4.2)

## 2023-12-03 ENCOUNTER — Ambulatory Visit: Admitting: Sleep Medicine

## 2023-12-17 ENCOUNTER — Telehealth: Payer: Self-pay

## 2023-12-17 NOTE — Progress Notes (Signed)
 Complex Care Management Care Guide Note  12/17/2023 Name: Nicole Chambers MRN: [id number] DOB: 30-Aug-1990  Nicole Chambers is a 33 y.o. year old female who is a primary care patient of Gareth Mliss FALCON, FNP and is actively engaged with the care management team. I reached out to [place] Duggin by phone today to assist with re-scheduling  with the Pharmacist.  Follow up plan: Unsuccessful telephone outreach attempt made. A HIPAA compliant phone message was left for the patient providing contact information and requesting a return call.  Jeoffrey Buffalo , RMA     [facility]  [facility] Guide  Direct Dial: [phone]  Website: [email or web address]

## 2023-12-22 ENCOUNTER — Ambulatory Visit: Admitting: Dermatology

## 2023-12-31 ENCOUNTER — Other Ambulatory Visit: Payer: Self-pay | Admitting: Nurse Practitioner

## 2023-12-31 ENCOUNTER — Other Ambulatory Visit: Payer: Self-pay

## 2023-12-31 DIAGNOSIS — F330 Major depressive disorder, recurrent, mild: Secondary | ICD-10-CM

## 2023-12-31 DIAGNOSIS — R739 Hyperglycemia, unspecified: Secondary | ICD-10-CM

## 2023-12-31 DIAGNOSIS — F419 Anxiety disorder, unspecified: Secondary | ICD-10-CM

## 2023-12-31 DIAGNOSIS — E119 Type 2 diabetes mellitus without complications: Secondary | ICD-10-CM

## 2023-12-31 DIAGNOSIS — E1165 Type 2 diabetes mellitus with hyperglycemia: Secondary | ICD-10-CM

## 2023-12-31 MED ORDER — METFORMIN HCL 1000 MG PO TABS
1000.0000 mg | ORAL_TABLET | Freq: Two times a day (BID) | ORAL | 1 refills | Status: AC
  Filled 2023-12-31 – 2024-01-14 (×2): qty 180, 90d supply, fill #0
  Filled 2024-05-04: qty 180, 90d supply, fill #1

## 2023-12-31 MED FILL — Escitalopram Oxalate Tab 20 MG (Base Equiv): ORAL | 90 days supply | Qty: 90 | Fill #0 | Status: CN

## 2023-12-31 NOTE — Telephone Encounter (Signed)
 Requested Prescriptions  Pending Prescriptions Disp Refills   metFORMIN  (GLUCOPHAGE ) 1000 MG tablet 180 tablet 1    Sig: Take 1 tablet (1,000 mg total) by mouth 2 (two) times daily with a meal.     Endocrinology:  Diabetes - Biguanides Passed - 12/31/2023  3:28 PM      Passed - Cr in normal range and within 360 days    Creat  Date Value Ref Range Status  11/26/2023 0.65 0.50 - 0.97 mg/dL Final   Creatinine, Urine  Date Value Ref Range Status  03/01/2023 261 20 - 275 mg/dL Final         Passed - HBA1C is between 0 and 7.9 and within 180 days    Hgb A1c MFr Bld  Date Value Ref Range Status  11/26/2023 7.5 (H) <5.7 % Final    Comment:    For someone without known diabetes, a hemoglobin A1c value of 6.5% or greater indicates that they may have  diabetes and this should be confirmed with a follow-up  test. . For someone with known diabetes, a value <7% indicates  that their diabetes is well controlled and a value  greater than or equal to 7% indicates suboptimal  control. A1c targets should be individualized based on  duration of diabetes, age, comorbid conditions, and  other considerations. . Currently, no consensus exists regarding use of hemoglobin A1c for diagnosis of diabetes for children. .          Passed - eGFR in normal range and within 360 days    GFR, Estimated  Date Value Ref Range Status  07/14/2021 >60 >60 mL/min Final    Comment:    (NOTE) Calculated using the CKD-EPI Creatinine Equation (2021)    eGFR  Date Value Ref Range Status  11/26/2023 121 > OR = 60 mL/min/1.22m2 Final  09/23/2021 123 >59 mL/min/1.73 Final         Passed - B12 Level in normal range and within 720 days    Vitamin B-12  Date Value Ref Range Status  11/26/2023 409 200 - 1,100 pg/mL Final         Passed - Valid encounter within last 6 months    Recent Outpatient Visits           1 month ago Other fatigue   [facility] Gareth Mliss FALCON, FNP   4  months ago Encounter for completion of form with patient   [facility] Gareth Mliss FALCON, FNP   5 months ago Uncontrolled type 2 diabetes mellitus with hyperglycemia [facility])   [facility] Gareth Mliss FALCON, FNP              Passed - CBC within normal limits and completed in the last 12 months    WBC  Date Value Ref Range Status  11/26/2023 6.3 3.8 - 10.8 Thousand/uL Final   RBC  Date Value Ref Range Status  11/26/2023 3.51 (L) 3.80 - 5.10 Million/uL Final   Hemoglobin  Date Value Ref Range Status  11/26/2023 9.7 (L) 11.7 - 15.5 g/dL Final   HCT  Date Value Ref Range Status  11/26/2023 31.0 (L) 35.0 - 45.0 % Final   MCHC  Date Value Ref Range Status  11/26/2023 31.3 (L) 32.0 - 36.0 g/dL Final    Comment:    For adults, a slight decrease in the calculated MCHC value (in the range of 30 to 32 g/dL) is most likely not  clinically significant; however, it should be interpreted with caution in correlation with other red cell parameters and the patient's clinical condition.    [facility]  Date Value Ref Range Status  11/26/2023 27.6 27.0 - 33.0 pg Final   MCV  Date Value Ref Range Status  11/26/2023 88.3 80.0 - 100.0 fL Final   No results found for: PLTCOUNTKUC, LABPLAT, POCPLA RDW  Date Value Ref Range Status  11/26/2023 15.9 (H) 11.0 - 15.0 % Final

## 2023-12-31 NOTE — Telephone Encounter (Signed)
 Requested Prescriptions  Pending Prescriptions Disp Refills   escitalopram  (LEXAPRO ) 20 MG tablet 90 tablet 1    Sig: Take 1 tablet (20 mg total) by mouth daily.     Psychiatry:  Antidepressants - SSRI Passed - 12/31/2023  6:00 PM      Passed - Completed PHQ-2 or PHQ-9 in the last 360 days      Passed - Valid encounter within last 6 months    Recent Outpatient Visits           1 month ago Other fatigue   [facility] Gareth Mliss FALCON, FNP   4 months ago Encounter for completion of form with patient   [facility] Gareth Mliss FALCON, FNP   5 months ago Uncontrolled type 2 diabetes mellitus with hyperglycemia [facility])   [facility] Gareth Mliss FALCON, FNP               liraglutide  (VICTOZA ) 18 MG/3ML SOPN [Pharmacy Med Name: liraglutide  (VICTOZA ) 18 MG/3ML Solution Pen-injector] 27 mL 0    Sig: Inject 1.8 mg into the skin daily at 6 (six) AM.     Endocrinology:  Diabetes - GLP-1 Receptor Agonists Passed - 12/31/2023  6:00 PM      Passed - HBA1C is between 0 and 7.9 and within 180 days    Hgb A1c MFr Bld  Date Value Ref Range Status  11/26/2023 7.5 (H) <5.7 % Final    Comment:    For someone without known diabetes, a hemoglobin A1c value of 6.5% or greater indicates that they may have  diabetes and this should be confirmed with a follow-up  test. . For someone with known diabetes, a value <7% indicates  that their diabetes is well controlled and a value  greater than or equal to 7% indicates suboptimal  control. A1c targets should be individualized based on  duration of diabetes, age, comorbid conditions, and  other considerations. . Currently, no consensus exists regarding use of hemoglobin A1c for diagnosis of diabetes for children. SABRA Amy - Valid encounter within last 6 months    Recent Outpatient Visits           1 month ago Other fatigue   [facility]  Gareth Mliss FALCON, FNP   4 months ago Encounter for completion of form with patient   [facility] Gareth Mliss FALCON, FNP   5 months ago Uncontrolled type 2 diabetes mellitus with hyperglycemia [facility])   [facility] Gareth Mliss FALCON, [place]

## 2024-01-03 ENCOUNTER — Other Ambulatory Visit: Payer: Self-pay

## 2024-01-03 ENCOUNTER — Ambulatory Visit: Admitting: Dermatology

## 2024-01-03 NOTE — Progress Notes (Signed)
 Complex Care Management Care Guide Note  01/03/2024 Name: Odilia Damico MRN: [id number] DOB: June 20, 1990  Kelvin Burpee is a 33 y.o. year old female who is a primary care patient of Gareth Mliss FALCON, FNP and is actively engaged with the care management team. I reached out to [place] V[place]boom by phone today to assist with re-scheduling  with the Pharmacist.  Follow up plan: Unsuccessful telephone outreach attempt made. A HIPAA compliant phone message was left for the patient providing contact information and requesting a return call.  Jeoffrey Buffalo , RMA     [facility]  [facility], [facility] Guide  Direct Dial: [phone]  Website: [email or web address]

## 2024-01-05 ENCOUNTER — Other Ambulatory Visit: Payer: Self-pay | Admitting: Nurse Practitioner

## 2024-01-05 DIAGNOSIS — E1165 Type 2 diabetes mellitus with hyperglycemia: Secondary | ICD-10-CM

## 2024-01-06 ENCOUNTER — Other Ambulatory Visit: Payer: Self-pay

## 2024-01-06 ENCOUNTER — Other Ambulatory Visit: Payer: Self-pay | Admitting: Nurse Practitioner

## 2024-01-06 DIAGNOSIS — E1165 Type 2 diabetes mellitus with hyperglycemia: Secondary | ICD-10-CM

## 2024-01-07 ENCOUNTER — Other Ambulatory Visit: Payer: Self-pay

## 2024-01-07 NOTE — Telephone Encounter (Signed)
 Requested medications are due for refill today.  unsure  Requested medications are on the active medications list.  yes  Last refill. 10/19/2023 27mL 0 rf  Future visit scheduled.   no  Notes to clinic.  Please review for refill.    Requested Prescriptions  Pending Prescriptions Disp Refills   liraglutide  (VICTOZA ) 18 MG/3ML SOPN [Pharmacy Med Name: liraglutide  (VICTOZA ) 18 MG/3ML Solution Pen-injector] 27 mL 0    Sig: Inject 1.8 mg into the skin daily at 6 (six) AM.     Endocrinology:  Diabetes - GLP-1 Receptor Agonists Passed - 01/07/2024  4:18 PM      Passed - HBA1C is between 0 and 7.9 and within 180 days    Hgb A1c MFr Bld  Date Value Ref Range Status  11/26/2023 7.5 (H) <5.7 % Final    Comment:    For someone without known diabetes, a hemoglobin A1c value of 6.5% or greater indicates that they may have  diabetes and this should be confirmed with a follow-up  test. . For someone with known diabetes, a value <7% indicates  that their diabetes is well controlled and a value  greater than or equal to 7% indicates suboptimal  control. A1c targets should be individualized based on  duration of diabetes, age, comorbid conditions, and  other considerations. . Currently, no consensus exists regarding use of hemoglobin A1c for diagnosis of diabetes for children. SABRA Amy - Valid encounter within last 6 months    Recent Outpatient Visits           1 month ago Other fatigue   [facility] Gareth Mliss FALCON, FNP   5 months ago Encounter for completion of form with patient   [facility] Gareth Mliss FALCON, FNP   5 months ago Uncontrolled type 2 diabetes mellitus with hyperglycemia [facility])   [facility] Gareth Mliss FALCON, [place]

## 2024-01-11 ENCOUNTER — Other Ambulatory Visit: Payer: Self-pay

## 2024-01-14 ENCOUNTER — Other Ambulatory Visit: Payer: Self-pay

## 2024-01-14 ENCOUNTER — Telehealth: Payer: Self-pay

## 2024-01-14 MED FILL — Escitalopram Oxalate Tab 20 MG (Base Equiv): ORAL | 90 days supply | Qty: 90 | Fill #0 | Status: AC

## 2024-01-14 NOTE — Progress Notes (Signed)
 Brief Telephone Documentation Reason for Call: Patient left message regarding question for pharmacist about Victoza   Summary of Call: Called patient 01/14/24. No answer, voicemail not set up at this time.   Morning Halberg E. Marsh, PharmD Clinical Pharmacist [facility] [phone]

## 2024-01-17 ENCOUNTER — Other Ambulatory Visit: Payer: Self-pay | Admitting: Nurse Practitioner

## 2024-01-17 ENCOUNTER — Other Ambulatory Visit: Payer: Self-pay

## 2024-01-17 DIAGNOSIS — E1165 Type 2 diabetes mellitus with hyperglycemia: Secondary | ICD-10-CM

## 2024-01-18 ENCOUNTER — Other Ambulatory Visit: Payer: Self-pay

## 2024-01-19 ENCOUNTER — Other Ambulatory Visit: Payer: Self-pay

## 2024-01-19 ENCOUNTER — Telehealth: Payer: Self-pay | Admitting: Pharmacist

## 2024-01-19 DIAGNOSIS — E1165 Type 2 diabetes mellitus with hyperglycemia: Secondary | ICD-10-CM

## 2024-01-19 NOTE — Progress Notes (Signed)
 Receive incoming call from patient. Reports that she has been unable to pick up a refill of liraglutide  from her pharmacy. From review of chart, note patient's prescription for liraglutide  (Victoza ) is out of refills and latest refill request that was sent to PCP office was denied.  Appears that patient is due for follow up appointment with PCP.  Advise patient to contact office today to schedule follow up appointment with PCP.  Will send message to PCP to ask provide if she would be willing to send a refill to pharmacy to last until patient seen  Advise provide patient with contact information Clinical Pharmacist Allyson Issac  Sharyle Sia, PharmD, [facility] [phone]

## 2024-01-19 NOTE — Telephone Encounter (Signed)
 Duplicate request.  Requested Prescriptions  Pending Prescriptions Disp Refills   liraglutide  (VICTOZA ) 18 MG/3ML SOPN [Pharmacy Med Name: liraglutide  (VICTOZA ) 18 MG/3ML Solution Pen-injector] 27 mL 0    Sig: Inject 1.8 mg into the skin daily at 6 (six) AM.     Endocrinology:  Diabetes - GLP-1 Receptor Agonists Passed - 01/19/2024  9:26 AM      Passed - HBA1C is between 0 and 7.9 and within 180 days    Hgb A1c MFr Bld  Date Value Ref Range Status  11/26/2023 7.5 (H) <5.7 % Final    Comment:    For someone without known diabetes, a hemoglobin A1c value of 6.5% or greater indicates that they may have  diabetes and this should be confirmed with a follow-up  test. . For someone with known diabetes, a value <7% indicates  that their diabetes is well controlled and a value  greater than or equal to 7% indicates suboptimal  control. A1c targets should be individualized based on  duration of diabetes, age, comorbid conditions, and  other considerations. . Currently, no consensus exists regarding use of hemoglobin A1c for diagnosis of diabetes for children. SABRA Amy - Valid encounter within last 6 months    Recent Outpatient Visits           1 month ago Other fatigue   [facility] Gareth Mliss FALCON, FNP   5 months ago Encounter for completion of form with patient   [facility] Gareth Mliss FALCON, FNP   6 months ago Uncontrolled type 2 diabetes mellitus with hyperglycemia [facility])   [facility] Gareth Mliss FALCON, FNP       Future Appointments             In 2 weeks Raymund, Lauraine BROCKS, MD [facility]

## 2024-01-20 ENCOUNTER — Other Ambulatory Visit: Payer: Self-pay

## 2024-01-20 ENCOUNTER — Encounter: Payer: Self-pay | Admitting: Pharmacist

## 2024-01-20 ENCOUNTER — Other Ambulatory Visit: Payer: Self-pay | Admitting: Nurse Practitioner

## 2024-01-20 DIAGNOSIS — E1165 Type 2 diabetes mellitus with hyperglycemia: Secondary | ICD-10-CM

## 2024-01-20 MED ORDER — LIRAGLUTIDE 18 MG/3ML ~~LOC~~ SOPN
1.8000 mg | PEN_INJECTOR | Freq: Every day | SUBCUTANEOUS | 1 refills | Status: DC
  Filled 2024-01-20: qty 27, 90d supply, fill #0

## 2024-01-21 ENCOUNTER — Other Ambulatory Visit: Payer: Self-pay | Admitting: Nurse Practitioner

## 2024-01-21 ENCOUNTER — Other Ambulatory Visit: Payer: Self-pay

## 2024-01-23 ENCOUNTER — Other Ambulatory Visit: Payer: Self-pay

## 2024-01-24 ENCOUNTER — Other Ambulatory Visit: Payer: Self-pay

## 2024-01-24 NOTE — Telephone Encounter (Signed)
 Requested medications are due for refill today.  unsure  Requested medications are on the active medications list.  no  Last refill. 07/30/2023  Future visit scheduled.   yes  Notes to clinic.  Please review for refill.    Requested Prescriptions  Pending Prescriptions Disp Refills   EMBECTA PEN NEEDLE ULTRAFINE 32G X 6 MM MISC [Pharmacy Med Name: Insulin  Pen Needle (PEN NEEDLES) 32G X 6 MM Misc] 100 each 1    Sig: Use daily with liraglutide      Endocrinology: Diabetes - Testing Supplies Passed - 01/24/2024  4:55 PM      Passed - Valid encounter within last 12 months    Recent Outpatient Visits           1 month ago Other fatigue   [facility] Gareth Mliss FALCON, FNP   5 months ago Encounter for completion of form with patient   [facility] Gareth Mliss FALCON, FNP   6 months ago Uncontrolled type 2 diabetes mellitus with hyperglycemia [facility])   [facility] Gareth Mliss FALCON, FNP       Future Appointments             In 1 week Raymund, Lauraine BROCKS, MD [facility]

## 2024-01-25 ENCOUNTER — Other Ambulatory Visit: Payer: Self-pay

## 2024-01-25 MED FILL — Insulin Pen Needle 32 G X 6 MM (1/4" or 15/64"): 90 days supply | Qty: 100 | Fill #0 | Status: AC

## 2024-01-26 ENCOUNTER — Other Ambulatory Visit: Payer: Self-pay

## 2024-01-27 ENCOUNTER — Ambulatory Visit: Admitting: Nurse Practitioner

## 2024-02-02 ENCOUNTER — Ambulatory Visit

## 2024-02-02 DIAGNOSIS — Z7189 Other specified counseling: Secondary | ICD-10-CM

## 2024-02-02 DIAGNOSIS — L700 Acne vulgaris: Secondary | ICD-10-CM

## 2024-02-02 NOTE — Progress Notes (Signed)
 Subjective   Nicole Chambers is a 33 y.o. female who presents for the following: Acne. Patient is established patient   Today patient reports: Acne concerns patient noticed flaring again at face, chest, and back. Patient uses Cerave face wash and Cerave moisturizer. Patient previously did course of Accutane  June 2022-July 2023. Does report worse flares around her cycle.   Review of Systems:    No other skin or systemic complaints except as noted in HPI or Assessment and Plan.  The following portions of the chart were reviewed this encounter and updated as appropriate: medications, allergies, medical history  Relevant Medical History:  n/a   Objective  Well appearing patient in no apparent distress; mood and affect are within normal limits. Examination was performed of the: Focused Exam of: Face, chest, back   Examination notable for: Acne vulgaris: Scattered open and closed comedones on the face. 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 Condition is symptomatic and bothersome to patient. Patient is flaring and not currently at treatment goal.  - Discussed various treatment options with patient, as well as need for consistent use for at least 6-12 weeks for full efficacy.  - Reviewed treatment options, including side effects of topical agents, oral antibiotics, OCPs (if female), oral spironolactone (if female), and isotretinoin . 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 Condition is not at treatment  goal  - Prior treatments: isotretinoin  (max dose 100 mg/kg)  - Discussed treatment options and patient would like to pursue treatment with isotretinoin  - Discussed side effects including but not limited to, dryness/irritation, headaches, vision changes, joint pains, GI distress, mood changes and emphasized need to report symptoms immediately if they arise. In addition, discussed the potential association of isotretinoin  and IBD.  - patient w/ hx of depression but states well controlled on lexapro   - Discussed that acne lesions may initially worsen with therapy. - Reviewed teratogenic potential of isotretinoin  and importance of not sharing medication and not donating blood; gave iPledge materials today to review. - Discussed need to check two serum or urine pregnancy tests before providing the first prescription (in fertile women at registration and at least 1 month later during the first 5 days of the menstrual cycle). In addition discussed the need to check monthly serum or urine pregnancy tests while taking the medication. - Educated patient about the 7-day prescription window in which the prescription must be filled, counting the day of the blood draw or urine sample as day 1. - Re-Enrolled in iPledge #: [id number]  High Risk Medication Use (isotretinoin ) - Will check UPT today, re-check in 1 month and can start after 2nd negative test Urine pregnancy test performed in office today and was negative. Lot: [id number] Exp: 07/26/2025   Patient demonstrates comprehension and confirms she will not get pregnant.   - Methods of contraception:Oral birth control, Female latex condoms  - Check ALT, Triglycerides  - Patient understands that she must not become pregnant while on the medication - Patient understands  to call with questions/concerns regarding the medication or side effects.  - Patient also understands importance of not sharing medication or donating blood while on therapy.  Isotretinoin   Counseling; Review and Contraception Counseling: Reviewed potential side effects of isotretinoin  including xerosis, cheilitis, hepatitis, hyperlipidemia, and severe birth defects if taken by a pregnant woman.  Women on isotretinoin  must be celibate (not having sex) or required to use at least 2 birth control methods to prevent pregnancy (unless patient is a female of non-child bearing potential).  Females of child-bearing potential must have monthly pregnancy tests while on isotretinoin  and report through I-Pledge (FDA monitoring program). Reviewed reports of suicidal ideation in those with a history of depression while taking isotretinoin  and reports of diagnosis of inflammatory bowl disease (IBD) while taking isotretinoin  as well as the lack of evidence for a causal relationship between isotretinoin , depression and IBD. Patient advised to reach out with any questions or concerns. Patient advised not to share pills or donate blood while on treatment or for one month after completing treatment. All patient's considering Isotretinoin  must read and understand and sign Isotretinoin  Consent Form and be registered with I-Pledge.  Consider cetirizine - increases efficacy Consider fish oil - helps with dryness     Level of service outlined above   Procedures, orders, diagnosis for this visit:  ACNE VULGARIS   Related Procedures ALT Triglycerides  Acne vulgaris -     ALT -     Triglycerides    Return to clinic: Return in about 1 month (around 03/04/2024) for isotretinoin  start , w/ Dr. Raymund.  I, Jacquelynn V. Wilfred, CMA, am acting as scribe for Lauraine JAYSON Raymund, MD .   Documentation: I have reviewed the above documentation for accuracy and completeness, and I agree with the above.  Lauraine JAYSON Raymund, MD

## 2024-02-02 NOTE — Patient Instructions (Addendum)
 Isotretinoin  (oral)  Pronunciation:  EYE so TRET i noyn Brand:  Absorica , Amnesteem , Claravis , Myorisan , Sotret , Zenatane   What is the most important information I should know about isotretinoin ? Isotretinoin  in just a single dose can cause severe birth defects or death of a baby. Never use this medicine if you are pregnant or may become pregnant. You must have a negative pregnancy test before taking isotretinoin . You will also be required to use two forms of birth control to prevent pregnancy while taking this medicine. Stop using isotretinoin  and call your doctor at once if you think you might be pregnant.  What is isotretinoin ? Isotretinoin  is a form of vitamin A. It reduces the amount of oil released by oil glands in your skin, and helps your skin renew itself more quickly. Isotretinoin  is used to treat severe nodular acne that has not responded to other treatments, including antibiotics. Isotretinoin  is available only from a certified pharmacy under a special program called iPLEDGE. Isotretinoin  may also be used for purposes not listed in this medication guide.  What should I discuss with my healthcare provider before taking isotretinoin ? Isotretinoin  can cause miscarriage, premature birth, severe birth defects, or death of a baby if the mother takes this medicine at the time of conception or during pregnancy. Even one dose of isotretinoin  can cause major birth defects of the baby's ears, eyes, face, skull, heart, and brain. Never use isotretinoin  if you are pregnant. For Women: Unless you have had your uterus and ovaries removed (total hysterectomy) or have been in menopause for at least 12 months in a row, you are considered to be of child-bearing potential. You must have a negative pregnancy test before you start taking isotretinoin , before each prescription is refilled, right after you take your last dose of isotretinoin , and again 30 days later. All pregnancy testing is required by the  [facility] program. You must agree in writing to use two specific forms of birth control beginning 30 days before you start taking isotretinoin  and ending 30 days after your last dose. Both a primary and a secondary form of birth control must be used together.  Primary forms of birth control include: tubal ligation (tubes tied); vasectomy of the female sexual partner; an IUD (intrauterine device); estrogen-containing birth control pills (not mini-pills); and hormonal birth control patches, implants, injections, or vaginal ring. Secondary forms of birth control include: a female latex condom with or without spermicide; a diaphragm plus a spermicide; a cervical cap plus a spermicide; and a vaginal sponge containing a spermicide.  Not having sexual intercourse (abstinence) is the most effective method of preventing pregnancy. Stop using isotretinoin  and call your doctor at once if you have unprotected sex, if you quit using birth control, if your period is late, or if you think you might be pregnant. If you get pregnant while taking isotretinoin , call the iPLEDGE pregnancy registry at [phone]. You should not use isotretinoin  if you are allergic to it. Tell your doctor if you have ever had: depression or mental illness; asthma; liver disease; diabetes; heart disease or high cholesterol; osteoporosis or low bone mineral density; an eating disorder such as anorexia; a food or drug allergy; or an intestinal disorder such as inflammatory bowel disease or ulcerative colitis.  It is dangerous to try and purchase isotretinoin  on the Internet or from vendors outside of the [place] . The sale and distribution of isotretinoin  outside of the iPLEDGE program violates the regulations of the U.S. Food and Drug Administration for the safe use  of this medication. You should not breast-feed while using this medicine. Isotretinoin  is not approved for use by anyone younger than 31 years old.  How  should I take isotretinoin ? Follow all directions on your prescription label and read all medication guides or instruction sheets. Use the medicine exactly as directed. Each prescription of isotretinoin  must be filled within 7 days of the date it was written by your doctor. You will receive no more than a 30-day supply of isotretinoin  at one time. Always take isotretinoin  with a full glass of water. Do not chew or suck on the capsule. Swallow it whole. Follow all directions about taking isotretinoin  with or without food. Use this medicine for the full prescribed length of time. Your acne may seem to get worse at first, but should then begin to improve. You may need frequent blood tests.  Never share this medicine with another person, even if they have the same symptoms you have.  Store at room temperature away from moisture, heat, and light.  What happens if I miss a dose? Skip the missed dose and use your next dose at the regular time. Do not use two doses at one time.  What happens if I overdose? Seek emergency medical attention or call the Poison Help line at [phone]. Overdose symptoms may include headache, dizziness, vomiting, stomach pain, warmth or tingling in your face, swollen or cracked lips, and loss of balance or coordination.  What should I avoid while taking isotretinoin ? Do not take a vitamin or mineral supplement that contains vitamin A.  Do not donate blood while taking isotretinoin  and for at least 30 days after you stop taking it.  Donated blood that is later given to a pregnant woman could lead to birth defects in her baby if the blood contains any level of isotretinoin . While you are taking isotretinoin  and for at least 6 months after your last dose: Do not use wax hair removers or have dermabrasion or laser skin treatments. Scarring may result. Isotretinoin  could make you sunburn more easily. Avoid sunlight or tanning beds. Wear protective clothing and use sunscreen  (SPF 30 or higher) when you are outdoors. Avoid driving or hazardous activity until you know how this medicine will affect you. Isotretinoin  may impair your vision, especially at night.  What are the possible side effects of isotretinoin ? Get emergency medical help if you have signs of an allergic reaction (hives, difficult breathing, swelling in your face or throat) or a severe skin reaction (fever, sore throat, burning eyes, skin pain, red or purple skin rash with blistering and peeling). Stop using isotretinoin  and call your doctor at once if you have: problems with your vision or hearing; hallucinations, (see or hearing things that are not real), thoughts about suicide or hurting yourself; depressed mood, crying spells, changes in behavior, feeling angry or irritable; loss of interest in things you enjoyed before, feeling hopeless or guilty; sleep problems, extreme tiredness, trouble concentrating; changes in weight or appetite; a seizure (convulsions), sudden numbness or weakness; muscle weakness, pain in your bones or joints or in your back; severe diarrhea, rectal bleeding, bloody or tarry stools; pale skin, feeling light-headed or short of breath; severe stomach or chest pain, pain when swallowing; or dark urine, or jaundice (yellowing of your skin or eyes). Common side effects may include: dryness of your skin, lips, eyes, or nose (you may have nosebleeds). This is not a complete list of side effects and others may occur. Call your doctor for medical advice  about side effects. You may report side effects to FDA at 1-800-FDA-1088.  What other drugs will affect isotretinoin ? Tell your doctor about all your other medicines, especially: phenytoin; St. John's wort; vitamin or mineral supplements; progestin-only birth control pills (mini-pills); steroid medicine; or a tetracycline antibiotic, including doxycycline or minocycline. This list is not complete. Other drugs may affect  isotretinoin , including prescription and over-the-counter medicines, vitamins, and herbal products. Not all possible drug interactions are listed here.  Where can I get more information? Your pharmacist can provide more information about isotretinoin . Remember, keep this and all other medicines out of the reach of children, never share your medicines with others, and use this medication only for the indication prescribed. Every effort has been made to ensure that the information provided by [employer] ('[employer]') is accurate, up-to-date, and complete, but no guarantee is made to that effect. Drug information contained herein may be time sensitive. [employer] information has been compiled for use by healthcare practitioners and consumers in the [place]  and therefore [employer] does not warrant that uses outside of the [place]  are appropriate, unless specifically indicated otherwise. [employer]'s drug information does not endorse drugs, diagnose patients or recommend therapy. [employer]'s drug information is an Investment banker, corporate to assist licensed healthcare practitioners in caring for their patients and/or to serve consumers viewing this service as a supplement to, and not a substitute for, the expertise, skill, knowledge and judgment of healthcare practitioners. The absence of a warning for a given drug or drug combination in no way should be construed to indicate that the drug or drug combination is safe, effective or appropriate for any given patient. Mult[employer] does not assume any responsibility for any aspect of healthcare administered with the aid of information [employer] provides. The information contained herein is not intended to cover all possible uses, directions, precautions, warnings, drug interactions, allergic reactions, or adverse effects. If you have questions about the drugs you are taking, check with your doctor, nurse or pharmacist.  Copyright [phone] [employer]  Version: 11.01. Revision date: 09/21/2016. Care instructions adapted under license by [facility]. If you have questions about a medical condition or this instruction, always ask your healthcare professional. Healthwise, Incorporated disclaims any warranty or liability for your use of this information.   Coping with the Dryness from Accutane   Dry eyes? Use preservative-free eye drops  Dry nose or bloody nose (due to dry nose)? Use saline nasal spray (NOT afrin-type products, saline only)  Dry face? Look for oil-free and non-comedogenic (means does not clog pores) lotions to moisturize acne-prone areas. Neutrogena, Aveeno, Oil of Olay. Sunscreen: Neutrogena Clear Face  Dry lips? Throughout the day and at bedtime, apply a lip balm that contains petrolatum/petroleum jelly or dimethicone. NOT chapstick, eos, burt's bees. Plain Vaseline (petroleum jelly) Aquaphor ointment Vaniply ointment Fix My Skin healing lip balm Cerave healing ointment  Dry skin on arms and legs?  Recommended body washes: Dove Sensitive Skin Nourishing Body Wash Unscented Aveeno Active Naturals Skin Relief Body Wash, Fragrance Free Cerave Hydrating Cleanser Olay Ultra Moisture Body Wash/ Olay Sensitive Body Wash Free & Clear (vanicream) liquid cleanser  Moisturizer:  Ointments and creams are thicker and thus provide better moisturization.   Recommended ointments: greasy, but do the best job at [employer] (vanicream) ointment [facility] [employer], Therapist, occupational, [employer]) Plain Vaseline (petroleum jelly) Aquaphor Healing Ointment Cerave Healing ointment  Recommended creams: Vanicream cream ([employer], Therapist, occupational, [employer]) Cetaphil Moisturizing Cream CeraVe Moisturizing Cream Aveeno [employer]  Eczema Therapy Fragrance Free Moisturizing Cream (even if not a baby!) Aveeno skin relief overnight cream Eucerin Body Creme Eczema Relief Eucerin Original Healing Soothing Repair  Cream Eucerin Professional Repair intensive repair cream Gold Bond Diabetics Dry Skin relief hand or foot cream  Gold Bond eczema relief cream (not lotion)  You can use eczema creams even if you do not have eczema. This just means they are more moisturizing.  Lotions (come in a pump) are the weakest moisturizers.          Plan for Acne  In the morning: - Cleanse face with a gentle cleanser OR benzoyl peroxide wash if instructed to use this at your visit(can be purchased over the counter- examples at the bottom) - Wipe face with clindamycin wipe if prescribed at your visit. This can be used on entire face/chest/back or as a spot treatment to active acne areas  - Apply an oil-free moisturizer  In the evening: - Cleanse face with a regular gentle face wash - Wait for skin to completely dry - Apply a pea sized amount of your retinoid (tretinoin or adapalene) to your finger. Dot this around your face, and then rub in - If your skin gets dry, you can follow this up with an oil-free moisturizer  If you were instructed to take minocycline or doxycycline. This is the antibiotic we talked about that you will take twice per day for a 3 month course. Take the medication with food, and don't lie down right after taking it, because it can give you heart burn if you lie down right away.  How to use your Acne creams  Some creams for acne PREVENT acne and some creams TARGET pimples you can see.  Antibiotic creams (erythromycin, benzoyl peroxide, clindamycin and sulfur sulfacetamide)  How to apply: generally applied once daily either as a spot treatment or all over the face (see above)  Retinoids (differin/adapalene, retin-A/tretinoin or tazorac) work by PREVENTING acne.  These medicines are good to control acne, but may cause dryness and increase your risk of sunburn.  Do not use if you are PREGNANT or BREAST FEEDING. It takes 4-6 weeks to have results and the acne may worsen in the beginning.   Some retinoids are stronger than others, but are also more irritating. These are prescription topical medications, used to treat acne, sun damage, fine wrinkles, and several other skin changes on the face. Medications in this family include tretinoin/Retin-A, adapalene/Differin, and Tazorac, among others.   A Note on Cosmetic Use: - Please note, if you are over the age of 34, insurance will typically not cover these medications. If they are recommended to you for non-medically necessary reasons, you may choose to pay for the medication out of pocket. Prices vary, but a tube of generic tretinoin can often be purchased for ~$60-100 (this size often lasts several months). This varies considerably, and we recommended that you check www.goodrx.com for prescription discount coupons and to compare prices across pharmacies.   How to apply: Use at night time (sunlight makes them inactive). If you wash your face at night, let the skin dry for 20 minutes before applying. Apply to all areas that have breakouts (NOT just to pimples you see). Use a PEA-SIZED amount for the entire face (no more) Dot it on your skin and connect the dots Avoid eyes and lips These may cause irritation in the beginning. To decrease irritation, do the following: 1st month: Use twice a week for the first month  2nd  month: Use every other night 3rd month and on: Use every night  Cleansing your skin: -   Do not use harsh "acne" soaps and astringents. - Use water to clean your face.  - If you wear make-up, sunscreen or creams, use non-comedogenic (non-pore clogging) gentle moisturizing wash or cream. Examples include: Cetaphil, Neutrogena, Clinique  Moisturizers and sunscreen: Apply a "non-comedogenic" (non-pore clogging) lotion with sunscreen in the morning. You may need to re-apply during the day. Neutrogena, Eucerin, Clinique, Vanicream    If you have dryness or irritation, try these tips: Decrease use to every other  night or twice weekly as tolerated  Wash the retinoid off after 1 hour and apply a "non-comedogenic" (non-pore-clogging) lotion If dryness or irritation continues, stop using the retinoid.   Benzoyl peroxide washes 3-5% (brand name includes Neutragena Clear Pore, CereVe Acne Foaming Cream Cleanser, Differin Cleanser) that you use in the shower. Can bleach linens (clothes, towels, etc), will NOT bleach your skin or hair). Dry off with a white towel so it doesn't take the color out of clothing and colored towels.       - Start a face moisturizer when dry --  for people with acne the lotion or cream should be oil-free and non-comedogenic - Examples are: CereVe PM, Cetaphil Face Lotion, Olay Complete, Neutragena Hydro Boost Gel Cream       Due to recent changes in healthcare laws, you may see results of your pathology and/or laboratory studies on MyChart before the doctors have had a chance to review them. We understand that in some cases there may be results that are confusing or concerning to you. Please understand that not all results are received at the same time and often the doctors may need to interpret multiple results in order to provide you with the best plan of care or course of treatment. Therefore, we ask that you please give us  2 business days to thoroughly review all your results before contacting the office for clarification. Should we see a critical lab result, you will be contacted sooner.   If You Need Anything After Your Visit  If you have any questions or concerns for your doctor, please call our main line at [phone] and press option 4 to reach your doctor's medical assistant. If no one answers, please leave a voicemail as directed and we will return your call as soon as possible. Messages left after 4 pm will be answered the following business day.   You may also send us  a message via MyChart. We typically respond to MyChart messages within 1-2 business days.  For  prescription refills, please ask your pharmacy to contact our office. Our fax number is [phone].  If you have an urgent issue when the clinic is closed that cannot wait until the next business day, you can page your doctor at the number below.    Please note that while we do our best to be available for urgent issues outside of office hours, we are not available 24/7.   If you have an urgent issue and are unable to reach us , you may choose to seek medical care at your doctor's office, retail clinic, urgent care center, or emergency room.  If you have a medical emergency, please immediately call 911 or go to the emergency department.  Pager Numbers  - Dr. Hester: [phone]  - Dr. Jackquline: [phone]  - Dr. Claudene: [phone]   In the event of inclement weather, please call our main line at [phone]  for an update on the status of any delays or closures.  Dermatology Medication Tips: Please keep the boxes that topical medications come in in order to help keep track of the instructions about where and how to use these. Pharmacies typically print the medication instructions only on the boxes and not directly on the medication tubes.   If your medication is too expensive, please contact our office at [phone] option 4 or send us  a message through MyChart.   We are unable to tell what your co-pay for medications will be in advance as this is different depending on your insurance coverage. However, we may be able to find a substitute medication at lower cost or fill out paperwork to get insurance to cover a needed medication.   If a prior authorization is required to get your medication covered by your insurance company, please allow us  1-2 business days to complete this process.  Drug prices often vary depending on where the prescription is filled and some pharmacies may offer cheaper prices.  The website www.goodrx.com contains coupons for medications through different  pharmacies. The prices here do not account for what the cost may be with help from insurance (it may be cheaper with your insurance), but the website can give you the price if you did not use any insurance.  - You can print the associated coupon and take it with your prescription to the pharmacy.  - You may also stop by our office during regular business hours and pick up a GoodRx coupon card.  - If you need your prescription sent electronically to a different pharmacy, notify our office through [facility] or by phone at [phone] option 4.     Si Usted Necesita Algo Despus de Su Visita  Tambin puede enviarnos un mensaje a travs de Clinical cytogeneticist. Por lo general respondemos a los mensajes de MyChart en el transcurso de 1 a 2 das hbiles.  Para renovar recetas, por favor pida a su farmacia que se ponga en contacto con nuestra oficina. Randi lakes de fax es [place] [phone].  Si tiene un asunto urgente cuando la clnica est cerrada y que no puede esperar hasta el siguiente da hbil, puede llamar/localizar a su doctor(a) al nmero que aparece a continuacin.   Por favor, tenga en cuenta que aunque hacemos todo lo posible para estar disponibles para asuntos urgentes fuera del horario de [place], no estamos disponibles las 24 horas del da, los 7 [address]  [address] de la [place].   Si tiene un problema urgente y no puede comunicarse con nosotros, puede optar por buscar atencin mdica  en el consultorio de su doctor(a), en una clnica privada, en un centro de atencin urgente o en una sala de emergencias.  Si tiene Engineer, drilling, por favor llame inmediatamente al 911 o vaya a la sala de emergencias.  Nmeros de bper  - Dr. Hester: [phone]  - Dra. Jackquline: [phone]  - Dr. Claudene: [phone]   En caso de inclemencias del tiempo, por favor llame a landry capes principal al [phone] para una actualizacin sobre el [place] de cualquier retraso o cierre.  Consejos para la  medicacin en dermatologa: Por favor, guarde las cajas en las que vienen los medicamentos de uso tpico para ayudarle a seguir las instrucciones sobre dnde y cmo usarlos. Las farmacias generalmente imprimen las instrucciones del medicamento slo en las cajas y no directamente en los tubos del [place].   Si su medicamento es [employer], por favor, pngase en contacto con  nuestra oficina llamando al [phone] y presione la opcin 4 o envenos un mensaje a travs de Clinical cytogeneticist.   No podemos decirle cul ser su copago por los medicamentos por adelantado ya que esto es diferente dependiendo de la cobertura de su seguro. Sin embargo, es posible que podamos encontrar un medicamento sustituto a Audiological scientist un formulario para que el seguro cubra el medicamento que se considera necesario.   Si se requiere una autorizacin previa para que su compaa de seguros malta su medicamento, por favor permtanos de 1 a 2 das hbiles para completar este proceso.  Los precios de los medicamentos varan con frecuencia dependiendo del Environmental consultant de dnde se surte la receta y alguna farmacias pueden ofrecer precios ms baratos.  El sitio web www.goodrx.com tiene cupones para medicamentos de Health and safety inspector. Los precios aqu no tienen en cuenta lo que podra costar con la ayuda del seguro (puede ser ms barato con su seguro), pero el sitio web puede darle el precio si no utiliz Tourist information centre manager.  - Puede imprimir el cupn correspondiente y llevarlo con su receta a la farmacia.  - Tambin puede pasar por nuestra oficina durante el horario de atencin regular y Education officer, museum una tarjeta de cupones de GoodRx.  - Si necesita que su receta se enve electrnicamente a una farmacia diferente, informe a nuestra oficina a travs de MyChart de [place] o por telfono llamando al [phone] y presione la opcin 4.

## 2024-02-03 ENCOUNTER — Ambulatory Visit: Payer: Self-pay

## 2024-02-03 LAB — ALT: ALT: 15 IU/L (ref 0–32)

## 2024-02-03 LAB — TRIGLYCERIDES: Triglycerides: 108 mg/dL (ref 0–149)

## 2024-02-03 NOTE — Telephone Encounter (Signed)
No answer, unable to leave voicemail

## 2024-02-03 NOTE — Telephone Encounter (Signed)
-----   Message from Lauraine JAYSON Kanaris sent at 02/03/2024  8:19 AM EDT ----- Please notify patient with below plan: Alt, triglycerides wnl  ----- Message ----- From: Interface, [facility] Lab Results In Sent: 02/03/2024   7:37 AM EDT To: Lauraine JAYSON Kanaris, MD

## 2024-02-07 NOTE — Telephone Encounter (Signed)
-----   Message from Lauraine JAYSON Kanaris sent at 02/03/2024  8:19 AM EDT ----- Please notify patient with below plan: Alt, triglycerides wnl  ----- Message ----- From: Interface, [facility] Lab Results In Sent: 02/03/2024   7:37 AM EDT To: Lauraine JAYSON Kanaris, MD

## 2024-02-07 NOTE — Telephone Encounter (Signed)
 No answer and no voicemail set up

## 2024-02-08 NOTE — Telephone Encounter (Signed)
-----   Message from Lauraine JAYSON Kanaris sent at 02/03/2024  8:19 AM EDT ----- Please notify patient with below plan: Alt, triglycerides wnl  ----- Message ----- From: Interface, [facility] Lab Results In Sent: 02/03/2024   7:37 AM EDT To: Lauraine JAYSON Kanaris, MD

## 2024-02-08 NOTE — Telephone Encounter (Signed)
 No answer and no voicemail set up

## 2024-02-09 ENCOUNTER — Other Ambulatory Visit: Payer: Self-pay

## 2024-02-11 ENCOUNTER — Ambulatory Visit: Admitting: Nurse Practitioner

## 2024-02-19 ENCOUNTER — Other Ambulatory Visit: Payer: Self-pay

## 2024-02-28 ENCOUNTER — Ambulatory Visit: Admitting: Nurse Practitioner

## 2024-03-01 ENCOUNTER — Ambulatory Visit: Admitting: Nurse Practitioner

## 2024-03-06 ENCOUNTER — Ambulatory Visit (INDEPENDENT_AMBULATORY_CARE_PROVIDER_SITE_OTHER)

## 2024-03-06 ENCOUNTER — Other Ambulatory Visit: Payer: Self-pay

## 2024-03-06 DIAGNOSIS — Z79899 Other long term (current) drug therapy: Secondary | ICD-10-CM | POA: Diagnosis not present

## 2024-03-06 DIAGNOSIS — L700 Acne vulgaris: Secondary | ICD-10-CM

## 2024-03-06 DIAGNOSIS — Z7189 Other specified counseling: Secondary | ICD-10-CM

## 2024-03-06 DIAGNOSIS — L708 Other acne: Secondary | ICD-10-CM

## 2024-03-06 MED ORDER — ISOTRETINOIN 40 MG PO CAPS
40.0000 mg | ORAL_CAPSULE | Freq: Every day | ORAL | 0 refills | Status: DC
  Filled 2024-03-06 (×2): qty 30, 30d supply, fill #0

## 2024-03-06 NOTE — Progress Notes (Signed)
 Subjective   Nicole Chambers is a 33 y.o. female who presents for the following: Follow up of isotretinoin  start. Patient is established patient .   Today patient reports: Patient here to start isotretinoin .   Review of Systems:    No other skin or systemic complaints except as noted in HPI or Assessment and Plan.  The following portions of the chart were reviewed this encounter and updated as appropriate: medications, allergies, medical history  Relevant Medical History:  n/a   Objective  (SKPE) Well appearing patient in no apparent distress; mood and affect are within normal limits. Examination was performed of the: Focused Exam of: Face   Examination notable for: Acne Vulgaris - SEVERE: many open and closed comedones with associated erythema as well as tender nodules some of which are crusted located on the face and upper trunk.  Acneiform scarring is also present. W/ pih   Examination limited by: Undergarments, Shoes or socks , and Clothing     Assessment & Plan  (SKAP)   Follow up of isotretinoin  start   Severe Inflammatory Acne, not controlled on current therapy Chronic condition with exacerbation or progression. Condition is not at treatment goal  - Prior treatments: isotretinoin  (max dose 100 mg/kg)  - Discussed treatment options and patient would like to pursue treatment with isotretinoin  - Discussed side effects including but not limited to, dryness/irritation, headaches, vision changes, joint pains, GI distress, mood changes and emphasized need to report symptoms immediately if they arise. In addition, discussed the potential association of isotretinoin  and IBD.       - Patient w/ hx of depression but states well controlled on lexapro  - she will notify us  if any mood changes  - Discussed that acne lesions may initially worsen with therapy. - Reviewed teratogenic potential of isotretinoin  and importance of not sharing medication and not donating blood; gave iPledge  materials today to review. - Discussed need to check two serum or urine pregnancy tests before providing the first prescription (in fertile women at registration and at least 1 month later during the first 5 days of the menstrual cycle). In addition discussed the need to check monthly serum or urine pregnancy tests while taking the medication. - Educated patient about the 7-day prescription window in which the prescription must be filled, counting the day of the blood draw or urine sample as day 1. - iPledge #: [id number]  - Start isotretinoin  40 mg daily   High Risk Medication Use (isotretinoin ) - Urine pregnancy test performed in office today and was negative.  Patient demonstrates comprehension and confirms she will not get pregnant.  Lot: [id number] Exp: 07/26/2025 - Methods of contraception: Oral birth control, Female latex condoms   - ALT, Triglycerides WNL  - Patient understands that she must not become pregnant while on the medication - Patient understands to call with questions/concerns regarding the medication or side effects.  - Patient also understands importance of not sharing medication or donating blood while on therapy.  Isotretinoin  Counseling; Review and Contraception Counseling: Reviewed potential side effects of isotretinoin  including xerosis, cheilitis, hepatitis, hyperlipidemia, and severe birth defects if taken by a pregnant woman.  Women on isotretinoin  must be celibate (not having sex) or required to use at least 2 birth control methods to prevent pregnancy (unless patient is a female of non-child bearing potential).  Females of child-bearing potential must have monthly pregnancy tests while on isotretinoin  and report through I-Pledge (FDA monitoring program). Reviewed reports of suicidal ideation in those  with a history of depression while taking isotretinoin  and reports of diagnosis of inflammatory bowl disease (IBD) while taking isotretinoin  as well as the lack of  evidence for a causal relationship between isotretinoin , depression and IBD. Patient advised to reach out with any questions or concerns. Patient advised not to share pills or donate blood while on treatment or for one month after completing treatment. All patient's considering Isotretinoin  must read and understand and sign Isotretinoin  Consent Form and be registered with I-Pledge.    Level of service outlined above   Patient instructions (SKPI)   Procedures, orders, diagnosis for this visit:    There are no diagnoses linked to this encounter.  Return to clinic: Return in about 1 month (around 04/05/2024) for isotretinoin  , w/ Dr. Raymund.  I, Jacquelynn V. Wilfred, CMA, am acting as scribe for Lauraine JAYSON Raymund, MD.  Documentation: I have reviewed the above documentation for accuracy and completeness, and I agree with the above.  Lauraine JAYSON Raymund, MD

## 2024-03-06 NOTE — Patient Instructions (Signed)

## 2024-03-07 ENCOUNTER — Ambulatory Visit: Payer: Self-pay

## 2024-03-07 ENCOUNTER — Other Ambulatory Visit: Payer: Self-pay

## 2024-03-07 VITALS — BP 115/71 | HR 81 | Ht 74.0 in | Wt 197.4 lb

## 2024-03-07 DIAGNOSIS — Z30013 Encounter for initial prescription of injectable contraceptive: Secondary | ICD-10-CM

## 2024-03-07 DIAGNOSIS — Z113 Encounter for screening for infections with a predominantly sexual mode of transmission: Secondary | ICD-10-CM

## 2024-03-07 DIAGNOSIS — Z3009 Encounter for other general counseling and advice on contraception: Secondary | ICD-10-CM

## 2024-03-07 DIAGNOSIS — Z3042 Encounter for surveillance of injectable contraceptive: Secondary | ICD-10-CM

## 2024-03-07 LAB — HM HIV SCREENING LAB: HM HIV Screening: NEGATIVE

## 2024-03-07 LAB — WET PREP FOR TRICH, YEAST, CLUE
Clue Cell Exam: NEGATIVE
Trichomonas Exam: NEGATIVE
Yeast Exam: NEGATIVE

## 2024-03-07 MED ORDER — MEDROXYPROGESTERONE ACETATE 150 MG/ML IM SUSP
150.0000 mg | INTRAMUSCULAR | Status: AC
  Administered 2024-03-07: 150 mg via INTRAMUSCULAR

## 2024-03-07 NOTE — Progress Notes (Signed)
 Pt is here for an acute visit. Wet Prep results reviewed with pt and requires no treatment. Depo IM injection given to pt at the Lt Deltoid and pt tolerated well to the injection with no complications. Condoms declined and reminder card given. Opportunity given to Patient to ask questions for any clarifications, questions answered. Wilkie Drought, RN.

## 2024-03-07 NOTE — Progress Notes (Signed)
 [facility] [address], [address] [place] [address] Main phone: [phone]  Family Planning Visit - Acute Visit/Depo  Subjective:  Nicole Chambers is a 33 y.o.  [id number]   being seen today for STI testing and Depo start. The patient is currently using female condom for pregnancy prevention. Patient does not want a pregnancy in the next year.   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 LMP 02/15/24 and she has used condoms consistently since her LMP. She recently went on Accutane . Not currently on contraception, wants reliable BC. Patient denies other concerns for today. Had a physical in January. Advised to return in 11 weeks for next Depo/full PE.  Review of Systems  All other systems reviewed and are negative.   Diabetes screening This patient is 33 y.o. with a BMI of Body mass index is 25.34 kg/m.SABRA  Is patient eligible for diabetes screening (age >35 and BMI >25)?  no  Was Hgb A1c ordered? no  STI screening Patient reports 1 of partners in last year.  Does this patient desire STI screening?  Yes  Hepatitis C screening Has  patient been screened once for HCV in the past?  Yes  No results found for: HCVAB  Does the patient meet criteria for HCV testing? No  (If yes-- Screen for HCV through [facility]) Criteria:  Since the last HCV result, does the patient have any of the following? - Current drug use - Have a partner with drug use - Has been incarcerated  Hepatitis B screening Does the patient meet criteria for HBV testing? No Criteria:  -Household, sexual or needle sharing contact with HBV -History of drug use -HIV positive -Those with known Hep C  Cervical Cancer Screening  Result Date Procedure Results Follow-ups  04/22/2023 Cytology - PAP High risk HPV: Negative Neisseria Gonorrhea: Negative Chlamydia: Negative Trichomonas: Negative Adequacy: Satisfactory for evaluation; transformation zone component PRESENT. Diagnosis: - Negative for intraepithelial lesion or malignancy (NILM) Microorganisms: Shift in flora suggestive of bacterial vaginosis Comment: Normal Reference Range Trichomonas - Negative Comment: Normal Reference Range HPV - Negative Comment: Normal Reference Ranger Chlamydia - Negative Comment: Normal Reference Range Neisseria Gonorrhea - Negative   04/25/2020 Cytology - PAP Adequacy: Satisfactory for evaluation; transformation zone component PRESENT. Diagnosis: - Negative for intraepithelial lesion or malignancy (NILM)     Health Maintenance Due  Topic Date Due   OPHTHALMOLOGY EXAM  11/21/2021   Influenza Vaccine  11/12/2023   Diabetic kidney evaluation - Urine ACR  02/29/2024   FOOT EXAM  02/29/2024    The following portions of the patient's history were reviewed and updated as appropriate: allergies, current medications, past family history, past medical history, past  social history, past surgical history and problem list. Problem list updated.  See flowsheet for further details and programmatic requirements Hyperlink available at the top of the signed note in blue.  Flow  sheet content below:  Pregnancy Intention Screening Does the patient want to become pregnant in the next year?: No Does the patient's partner want to become pregnant in the next year?: No Would the patient like to discuss contraceptive options today?: Yes Other:  Password: [id number]  Objective:   Vitals:   03/07/24 1459  BP: 115/71  Pulse: 81  Weight: 197 lb 6.4 oz (89.5 kg)  Height: 6' 2 (1.88 m)   Physical Exam Vitals and nursing note reviewed. Exam conducted with a chaperone present Brett Orange).  Constitutional:      Appearance: Normal appearance.  HENT:     Head: Normocephalic and atraumatic.     Mouth/Throat:     Mouth: Mucous membranes are moist.     Pharynx: Oropharynx is clear. No oropharyngeal exudate or posterior oropharyngeal erythema.  Eyes:     General: No scleral icterus.       Right eye: No discharge.        Left eye: No discharge.  Pulmonary:     Effort: Pulmonary effort is normal.  Abdominal:     General: Abdomen is flat.     Palpations: There is no mass.     Tenderness: There is no abdominal tenderness. There is no rebound.  Genitourinary:    General: Normal vulva.     Exam position: Lithotomy position.     Pubic Area: No rash or pubic lice.      Labia:        Right: No rash or lesion.        Left: No rash or lesion.      Vagina: Vaginal discharge present. No erythema, bleeding or lesions.     Cervix: No cervical motion tenderness, discharge, friability, lesion or erythema.     Comments: pH = <4.5 White, thick, creamy discharge Lymphadenopathy:     Cervical: No cervical adenopathy.     Upper Body:     Right upper body: No supraclavicular adenopathy.     Left upper body: No supraclavicular adenopathy.  Skin:    General: Skin is warm and dry.     Findings: No rash.  Neurological:     General: No focal deficit present.     Mental Status: She is alert and oriented to person, place, and time.  Psychiatric:        Mood and Affect: Mood normal.         Behavior: Behavior normal. Behavior is cooperative.    Assessment and Plan:  Nicole Chambers is a 33 y.o. female presenting to the [facility] Department for an initial annual wellness/contraceptive visit  Family planning  Contraception counseling:  Reviewed options based on patient desire and reproductive life plan. Patient is interested in Hormonal Injection. This was provided to the patient today.  Risks, benefits, and typical effectiveness rates were reviewed.  Questions were answered.  Written information was also given to the patient to review.    The patient will follow up in  3 months for surveillance.  The patient was told to call with any further questions, or with any concerns about this method of contraception.  Emphasized use of condoms 100% of the time for STI prevention.  Emergency Contraception Precautions (ECP): Patient assessed for need of ECP. She is not a candidate based on  barrier method used 100% correctly during intercourse per patient's confident report.   2. Screening for venereal disease  - WET PREP FOR TRICH, YEAST, CLUE - Chlamydia/Gonorrhea [facility] - HIV [place] [facility] - Syphilis Serology, [place] [facility]  3. Encounter for Depo-Provera  contraception  - medroxyPROGESTERone  (DEPO-PROVERA ) injection 150 mg   Return in about 11 weeks (around 05/23/2024) for full PE and Depo.  Future Appointments  Date Time Provider Department Center  03/16/2024  8:40 AM Gareth Mliss FALCON, FNP [facility]  04/10/2024  9:30 AM Raymund Lauraine BROCKS, MD [facility] None   Damien FORBES Satchel, NP

## 2024-03-16 ENCOUNTER — Ambulatory Visit: Admitting: Nurse Practitioner

## 2024-03-16 ENCOUNTER — Other Ambulatory Visit: Payer: Self-pay

## 2024-03-16 ENCOUNTER — Encounter: Payer: Self-pay | Admitting: Nurse Practitioner

## 2024-03-16 VITALS — BP 136/82 | HR 81 | Temp 98.4°F | Ht 74.0 in | Wt 195.0 lb

## 2024-03-16 DIAGNOSIS — F330 Major depressive disorder, recurrent, mild: Secondary | ICD-10-CM

## 2024-03-16 DIAGNOSIS — E1165 Type 2 diabetes mellitus with hyperglycemia: Secondary | ICD-10-CM

## 2024-03-16 DIAGNOSIS — Z7984 Long term (current) use of oral hypoglycemic drugs: Secondary | ICD-10-CM

## 2024-03-16 DIAGNOSIS — F419 Anxiety disorder, unspecified: Secondary | ICD-10-CM | POA: Diagnosis not present

## 2024-03-16 DIAGNOSIS — Z23 Encounter for immunization: Secondary | ICD-10-CM | POA: Diagnosis not present

## 2024-03-16 DIAGNOSIS — E782 Mixed hyperlipidemia: Secondary | ICD-10-CM

## 2024-03-16 DIAGNOSIS — E1140 Type 2 diabetes mellitus with diabetic neuropathy, unspecified: Secondary | ICD-10-CM

## 2024-03-16 LAB — POCT GLYCOSYLATED HEMOGLOBIN (HGB A1C): Hemoglobin A1C: 6.7 % — AB (ref 4.0–5.6)

## 2024-03-16 MED ORDER — LIRAGLUTIDE 18 MG/3ML ~~LOC~~ SOPN
1.8000 mg | PEN_INJECTOR | Freq: Every day | SUBCUTANEOUS | 3 refills | Status: AC
  Filled 2024-03-16 – 2024-05-04 (×2): qty 27, 90d supply, fill #0

## 2024-03-16 NOTE — Progress Notes (Addendum)
 BP 136/82   Pulse 81   Temp 98.4 F (36.9 C)   Ht 6' 2 (1.88 m)   Wt 195 lb (88.5 kg)   LMP 02/15/2024 (Exact Date)   SpO2 98%   BMI 25.04 kg/m    Subjective:    Patient ID: Nicole Chambers, female    DOB: 12-04-1990, 33 y.o.   MRN: [id number]  HPI: Nicole Chambers is a 33 y.o. female  Chief Complaint  Patient presents with   Medication Management    Follow up for insulin .    Discussed the use of AI scribe software for clinical note transcription with the patient, who gave verbal consent to proceed.  History of Present Illness Nicole Chambers is a 33 year old female with diabetes, asthma, anxiety, depression, and hyperlipidemia who presents for a routine follow-up.  Glycemic control - Diabetes management has improved with current A1c at 6.7 (previously 7.5 in August and 8.4 prior) - Home blood glucose levels average 120 mg/dL - Currently taking Victoza  1.8 mg daily and metformin  1000 mg twice daily - Previous difficulty obtaining Victoza  has been resolved  Mood and motivation - Mood is stable on Lexapro  20 mg daily - Persistent fatigue and lack of motivation - Able to manage daily activities despite these symptoms  asthma - Uses albuterol  inhaler as needed - No breathing issues  Lipid management - Last lipid panel in July 2024 showed LDL of 116 mg/dL  Preventive care needs - Due for microalbumin urine test - Due for foot exam - Due for eye exam  Occupational factors - Works night shifts, finishing at 7 AM - Commutes 30 minutes after shift         03/16/2024    9:07 AM 07/22/2023    8:11 AM 03/01/2023    3:16 PM  Depression screen PHQ 2/9  Decreased Interest 3 0 0  Down, Depressed, Hopeless 1 0 0  PHQ - 2 Score 4 0 0  Altered sleeping 3 0 0  Tired, decreased energy 3 2 2   Change in appetite 2 0 0  Feeling bad or failure about yourself  0 0 0  Trouble concentrating 1 0 1  Moving slowly or fidgety/restless 2 0 0  Suicidal thoughts 0 0   PHQ-9 Score 15 2  3     Difficult doing work/chores Somewhat difficult Not difficult at all      Data saved with a previous flowsheet row definition       03/16/2024    9:08 AM 07/22/2023    8:11 AM 03/01/2023    3:18 PM 10/29/2022    3:36 PM  GAD 7 : Generalized Anxiety Score  Nervous, Anxious, on Edge 1 1 1  0  Control/stop worrying 0 1 1 0  Worry too much - different things 0 1 1 0  Trouble relaxing 1 0 0 0  Restless 0 0 0 0  Easily annoyed or irritable 1 1 1  0  Afraid - awful might happen 0 0 0 0  Total GAD 7 Score 3 4 4  0  Anxiety Difficulty  Not difficult at all Not difficult at all Not difficult at all     Relevant past medical, surgical, family and social history reviewed and updated as indicated. Interim medical history since our last visit reviewed. Allergies and medications reviewed and updated.  Review of Systems  Constitutional: Negative for fever or weight change.  Respiratory: Negative for cough and shortness of breath.   Cardiovascular: Negative for chest pain or  palpitations.  Gastrointestinal: Negative for abdominal pain, no bowel changes.  Musculoskeletal: Negative for gait problem or joint swelling.  Skin: Negative for rash.  Neurological: Negative for dizziness or headache.  No other specific complaints in a complete review of systems (except as listed in HPI above).      Objective:      BP 136/82   Pulse 81   Temp 98.4 F (36.9 C)   Ht 6' 2 (1.88 m)   Wt 195 lb (88.5 kg)   LMP 02/15/2024 (Exact Date)   SpO2 98%   BMI 25.04 kg/m    Wt Readings from Last 3 Encounters:  03/16/24 195 lb (88.5 kg)  03/07/24 197 lb 6.4 oz (89.5 kg)  11/26/23 200 lb 9.6 oz (91 kg)    Physical Exam VITALS: BP- 136/82 MEASUREMENTS: Weight- 195. GENERAL: Alert, cooperative, well developed, no acute distress HEENT: Normocephalic, normal oropharynx, moist mucous membranes CHEST: Clear to auscultation bilaterally, No wheezes, rhonchi, or crackles CARDIOVASCULAR: Normal heart rate and  rhythm, S1 and S2 normal without murmurs ABDOMEN: Soft, non-tender, non-distended, without organomegaly, Normal bowel sounds EXTREMITIES: No cyanosis or edema NEUROLOGICAL: Cranial nerves grossly intact, Moves all extremities without gross motor or sensory deficit   Diabetic Foot Exam - Simple   Simple Foot Form Diabetic Foot exam was performed with the following findings: Yes 03/16/2024  9:07 AM  Visual Inspection No deformities, no ulcerations, no other skin breakdown bilaterally: Yes Sensation Testing Intact to touch and monofilament testing bilaterally: Yes Pulse Check Posterior Tibialis and Dorsalis pulse intact bilaterally: Yes Comments     Results for orders placed or performed in visit on 03/16/24  POCT HgB A1C   Collection Time: 03/16/24  9:06 AM  Result Value Ref Range   Hemoglobin A1C 6.7 (A) 4.0 - 5.6 %   HbA1c POC (<> result, manual entry)     HbA1c, POC (prediabetic range)     HbA1c, POC (controlled diabetic range)            Assessment & Plan:   Problem List Items Addressed This Visit       Endocrine   Uncontrolled type 2 diabetes mellitus with hyperglycemia (HCC) - Primary   Relevant Medications   liraglutide  (VICTOZA ) 18 MG/3ML SOPN     Other   Mixed hyperlipidemia   Mild episode of recurrent major depressive disorder   Anxiety   Other Visit Diagnoses       Immunization due       Relevant Orders   Flu vaccine trivalent PF, 6mos and older(Flulaval,Afluria,Fluarix,Fluzone) (Completed)        Assessment and Plan Assessment & Plan Type 2 diabetes mellitus Improved glycemic control. A1c decreased from 7.6% in August to 6.7% currently. Blood glucose levels at home are around 120 mg/dL. No current neuropathy symptoms. - Continue Victoza  1.8 mg daily and metformin  1000 mg twice daily. - Ordered microalbumin urine test to monitor kidney function. - Schedule diabetic eye exam.  Asthma Well-managed with current medication regimen. - Continue  albuterol  inhaler as needed.  Hyperlipidemia Last LDL of 116 mg/dL in July 7975.  Depression and anxiety disorder Well-managed with current medication regimen. Mood is stable. - Continue Lexapro  20 mg daily.        Follow up plan: Return in about 6 months (around 09/14/2024) for follow up.

## 2024-03-17 ENCOUNTER — Ambulatory Visit: Payer: Self-pay | Admitting: Nurse Practitioner

## 2024-03-17 LAB — MICROALBUMIN / CREATININE URINE RATIO
Creatinine, Urine: 176 mg/dL (ref 20–275)
Microalb Creat Ratio: 3 mg/g{creat} (ref <30)
Microalb, Ur: 0.5 mg/dL

## 2024-03-20 ENCOUNTER — Other Ambulatory Visit: Payer: Self-pay

## 2024-03-20 ENCOUNTER — Other Ambulatory Visit: Payer: Self-pay | Admitting: Nurse Practitioner

## 2024-03-20 DIAGNOSIS — L219 Seborrheic dermatitis, unspecified: Secondary | ICD-10-CM

## 2024-03-21 ENCOUNTER — Other Ambulatory Visit: Payer: Self-pay | Admitting: Nurse Practitioner

## 2024-03-21 ENCOUNTER — Other Ambulatory Visit: Payer: Self-pay

## 2024-03-21 DIAGNOSIS — L219 Seborrheic dermatitis, unspecified: Secondary | ICD-10-CM

## 2024-03-22 ENCOUNTER — Other Ambulatory Visit: Payer: Self-pay

## 2024-03-22 MED FILL — Ketoconazole Shampoo 2%: CUTANEOUS | 84 days supply | Qty: 120 | Fill #0 | Status: AC

## 2024-03-22 NOTE — Telephone Encounter (Signed)
 Requested medication (s) are due for refill today: yes  Requested medication (s) are on the active medication list: yes  Last refill:  03/01/23  Future visit scheduled: {Yes  Notes to clinic:  Unable to refill per protocol, cannot delegate.      Requested Prescriptions  Pending Prescriptions Disp Refills   ketoconazole  (NIZORAL ) 2 % shampoo 120 mL 6    Sig: Apply 1 application topically 2 (two) times a week. Wash scalp 2 times weekly, let sit 5 minutes and rinse out     Not Delegated - Over the Counter: OTC 2 Failed - 03/22/2024 11:48 AM      Failed - This refill cannot be delegated      Passed - Valid encounter within last 12 months    Recent Outpatient Visits           6 days ago Type 2 diabetes mellitus with diabetic neuropathy, without Schnorr-term current use of insulin  [facility])   [facility] Gareth Mliss FALCON, FNP   3 months ago Other fatigue   [facility] Gareth Mliss FALCON, FNP   7 months ago Encounter for completion of form with patient   [facility] Gareth Mliss FALCON, FNP   8 months ago Uncontrolled type 2 diabetes mellitus with hyperglycemia [facility])   [facility] Gareth Mliss FALCON, FNP       Future Appointments             In 2 weeks Nicole Chambers, Nicole BROCKS, MD [facility]

## 2024-03-23 ENCOUNTER — Other Ambulatory Visit: Payer: Self-pay

## 2024-03-23 NOTE — Telephone Encounter (Signed)
 Refilled on 03/22/24, duplicate request.  Requested Prescriptions  Pending Prescriptions Disp Refills   ketoconazole  (NIZORAL ) 2 % shampoo 120 mL 6    Sig: Apply 1 application topically 2 (two) times a week. Wash scalp 2 times weekly, let sit 5 minutes and rinse out     Not Delegated - Over the Counter: OTC 2 Failed - 03/23/2024  9:59 AM      Failed - This refill cannot be delegated      Passed - Valid encounter within last 12 months    Recent Outpatient Visits           1 week ago Type 2 diabetes mellitus with diabetic neuropathy, without Kisner-term current use of insulin  [facility])   [facility] Gareth Mliss FALCON, FNP   3 months ago Other fatigue   [facility] Gareth Mliss FALCON, FNP   7 months ago Encounter for completion of form with patient   [facility] Gareth Mliss FALCON, FNP   8 months ago Uncontrolled type 2 diabetes mellitus with hyperglycemia [facility])   [facility] Gareth Mliss FALCON, FNP       Future Appointments             In 2 weeks Raymund, Lauraine BROCKS, MD [facility]

## 2024-03-30 IMAGING — CR DG CHEST 2V
1 series · 2 of 2 positions shown · non-contrast
Comparison: None

CLINICAL DATA: LEFT side chest pain beginning 1 week ago

EXAM:
CHEST - 2 VIEW

[Series 1: dg chest 2 view · 0.14mm/px · 2 of 2 slices shown]
[im 1/2]
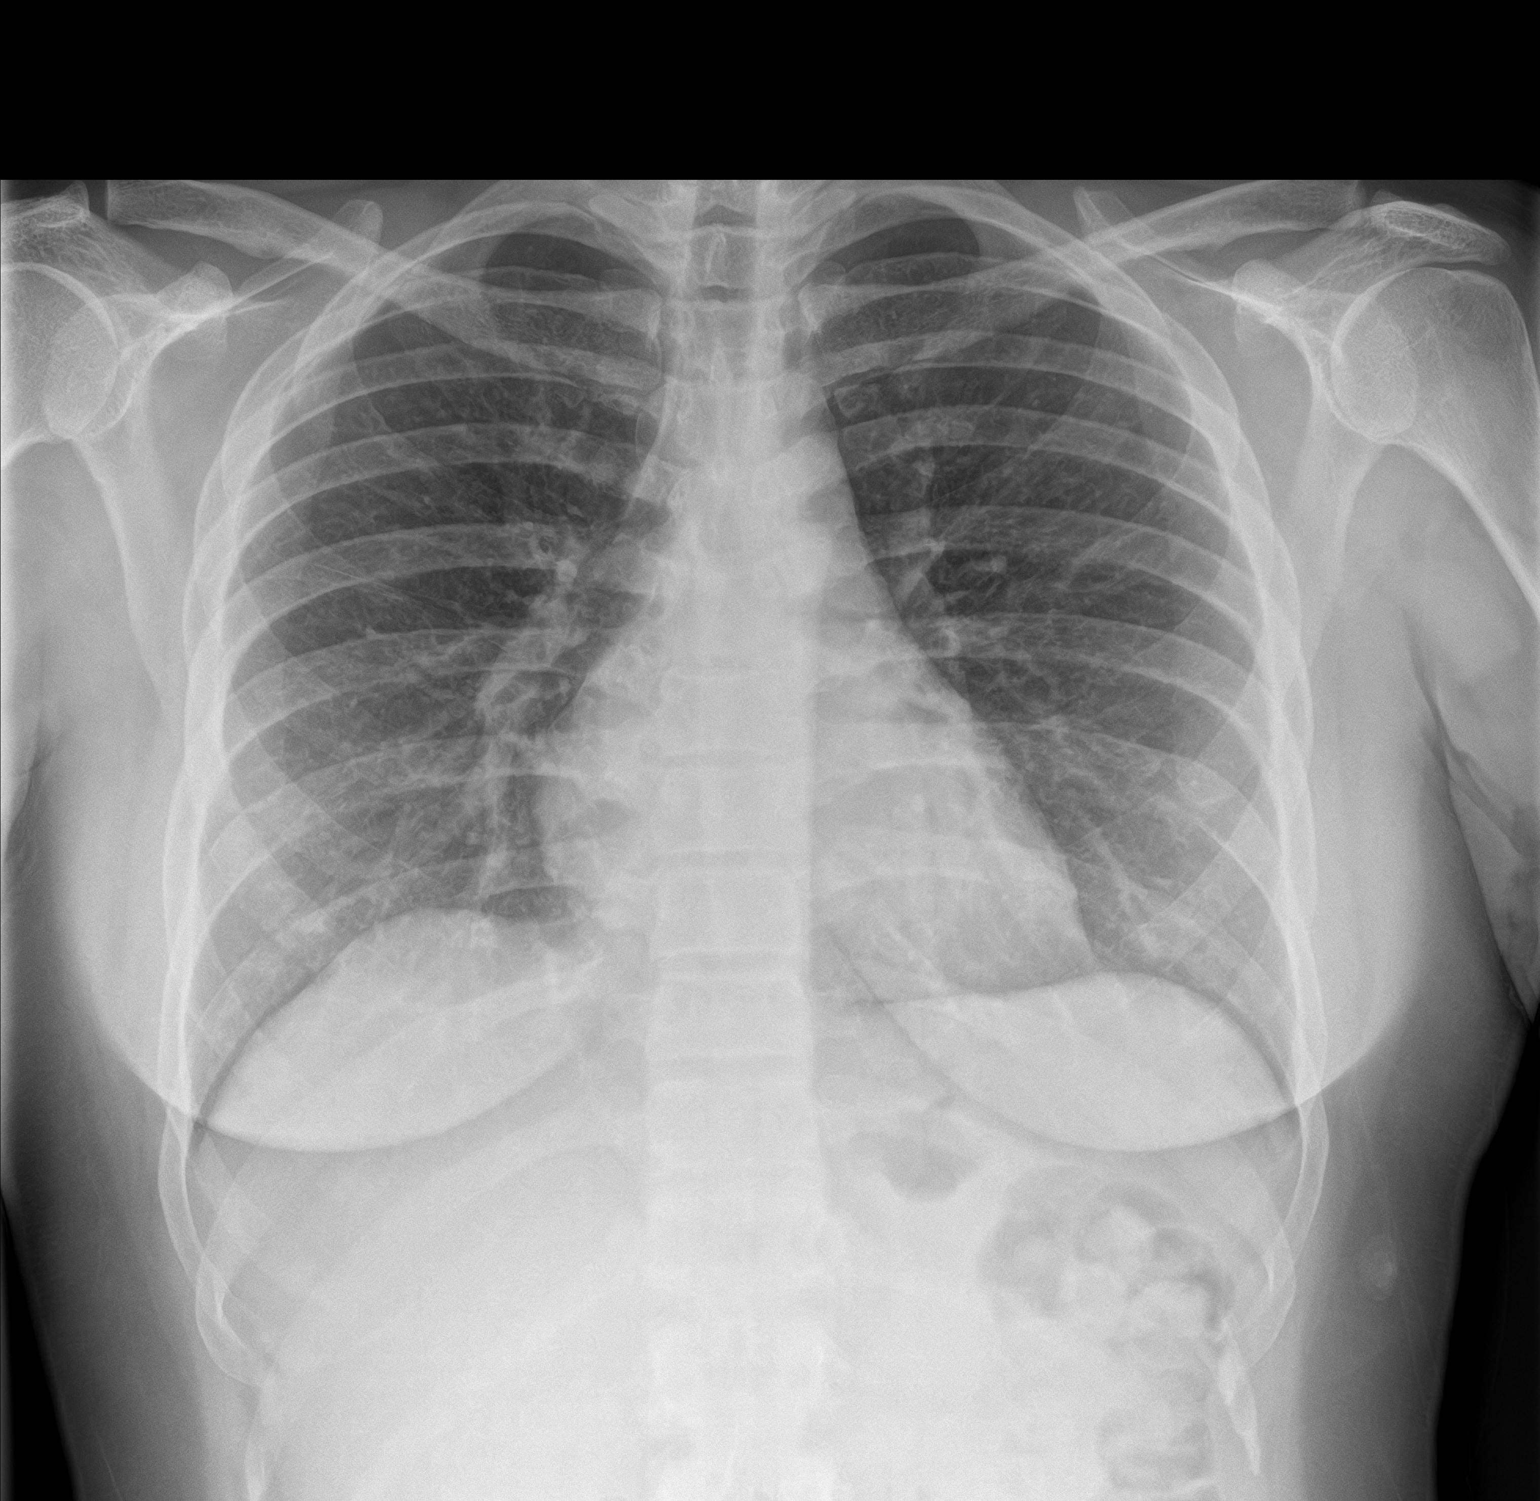
[im 2/2]
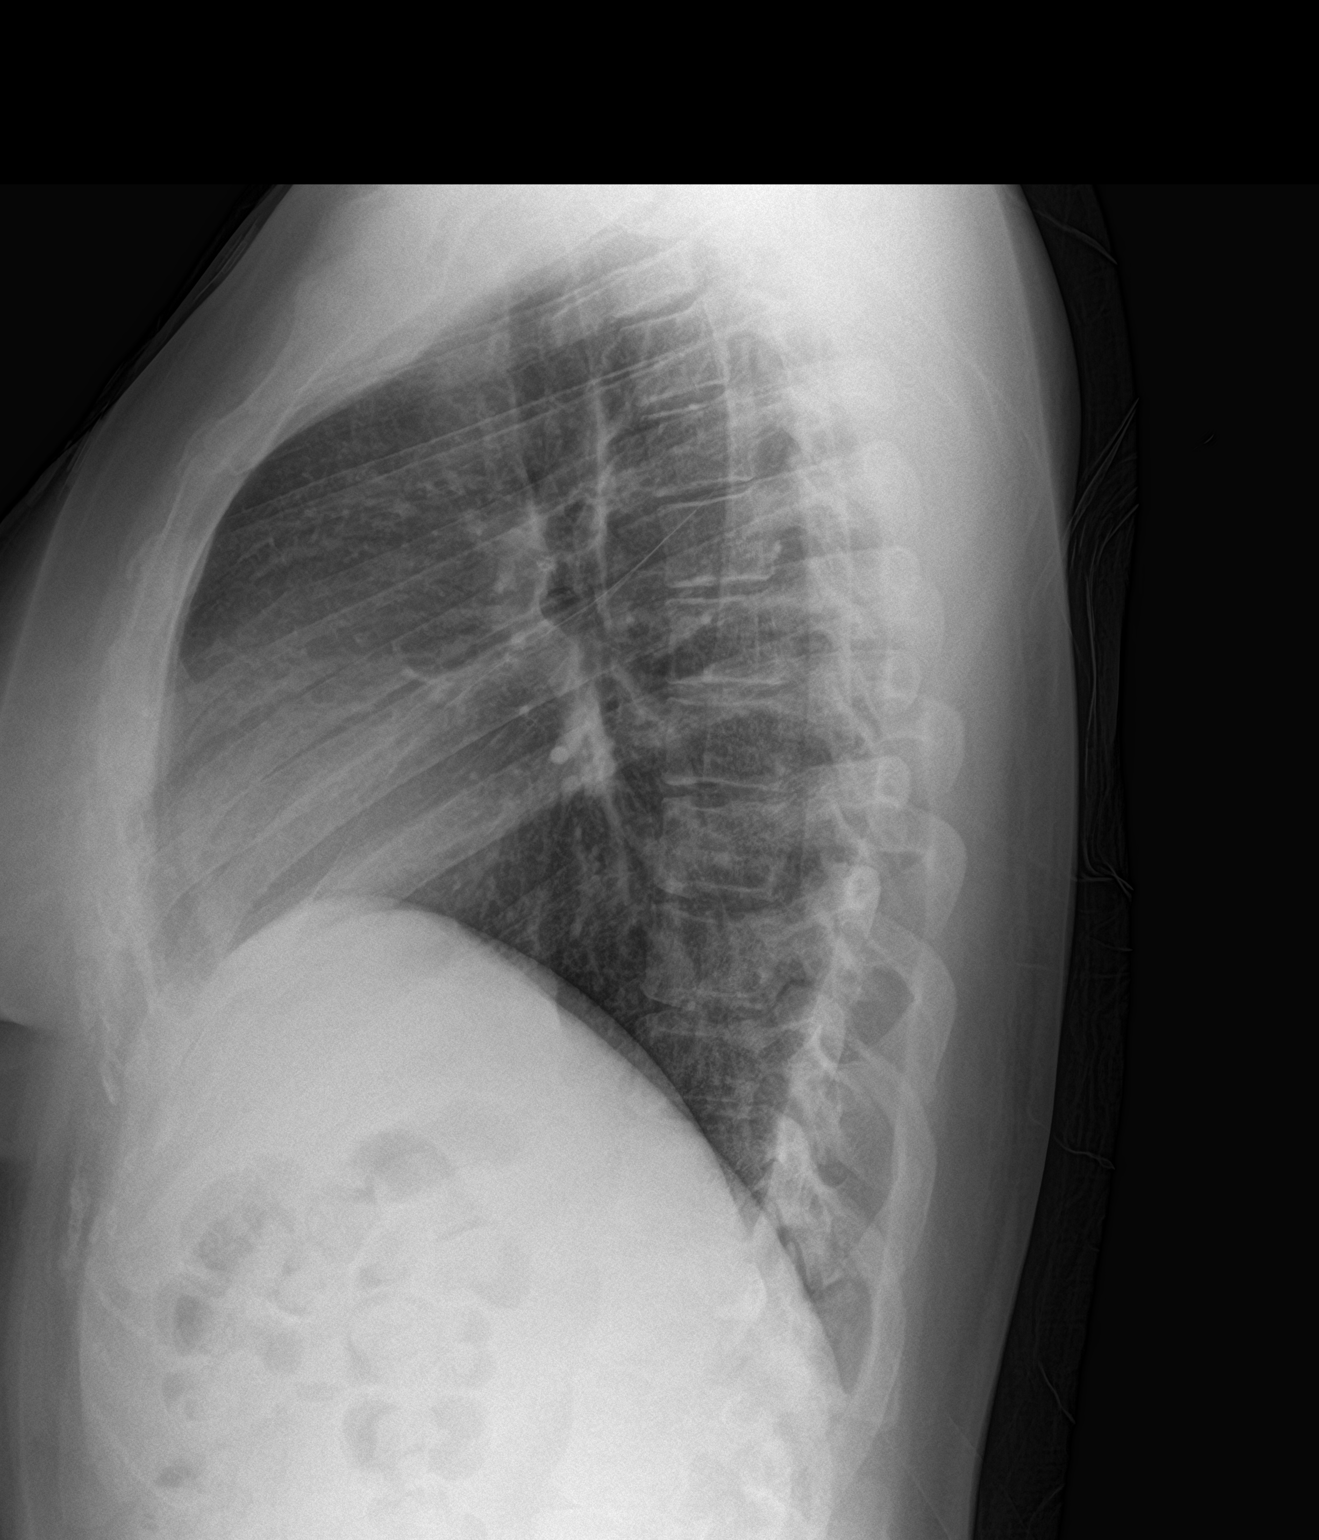

[2 of 2 positions shown; findings below may reference images not displayed]

FINDINGS: Normal heart size, mediastinal contours, and pulmonary vascularity.

Lungs clear.

No pleural effusion or pneumothorax.

Bones unremarkable.
IMPRESSION: Normal exam.

## 2024-04-10 ENCOUNTER — Encounter: Payer: Self-pay | Admitting: Pharmacist

## 2024-04-10 ENCOUNTER — Ambulatory Visit (INDEPENDENT_AMBULATORY_CARE_PROVIDER_SITE_OTHER)

## 2024-04-10 ENCOUNTER — Other Ambulatory Visit: Payer: Self-pay

## 2024-04-10 DIAGNOSIS — Z79899 Other long term (current) drug therapy: Secondary | ICD-10-CM | POA: Diagnosis not present

## 2024-04-10 DIAGNOSIS — L700 Acne vulgaris: Secondary | ICD-10-CM

## 2024-04-10 DIAGNOSIS — Z7189 Other specified counseling: Secondary | ICD-10-CM | POA: Diagnosis not present

## 2024-04-10 MED ORDER — ISOTRETINOIN 40 MG PO CAPS
80.0000 mg | ORAL_CAPSULE | Freq: Every day | ORAL | 0 refills | Status: DC
  Filled 2024-04-10 (×2): qty 60, 30d supply, fill #0

## 2024-04-10 NOTE — Progress Notes (Signed)
 "   Subjective   Nicole Chambers is a 33 y.o. female who presents for the following: Acne on Accutane  . Patient is established patient   Today patient reports: Patient is here for Accutane  f/u. She has been experiencing dryness.   Review of Systems:    Isotretinoin  ROS - denies any bone pain, muscle pain, nausea, vomiting, diarrhea, blood in stool or urine .  The following portions of the chart were reviewed this encounter and updated as appropriate: medications, allergies, medical history  Relevant Medical History:  n/a   Objective  (SKPE) Well appearing patient in no apparent distress; mood and affect are within normal limits. Examination was performed of the: Focused Exam of: face   Examination notable for: Acne vulgaris: Scattered open and closed comedones on the face. Red, inflammatory papules and pustules on face   Examination limited by: Undergarments, Shoes or socks , and Clothing     Assessment & Plan  (SKAP)   Severe Inflammatory Acne, on Isotretinoin  Chronic condition with exacerbation or progression. Condition is not at treatment goal.  - Previous doses (40 mg daily)  - iPledge #: [id number] - Dosage weight: 195lb or 88.5 kg  - Cumulative dose: 13.55 mg/kg  Total mg 1200  - increase isotretinoin  80 mg daily   High Risk Medication Use (isotretinoin ) - UPT obtained today and negative - Urine pregnancy test performed in office today and was negative.  Patient demonstrates comprehension and confirms she will not get pregnant.  Lot: [id number] Exp: 09-29-2025 - Methods of contraception: Oral birth control, Female latex condoms   - Baseline ALT, TG wnl  - Patient understands that she must not become pregnant while on the medication - Patient understands to call with questions/concerns regarding the medication or side effects.  - Patient also understands importance of not sharing medication or donating blood while on therapy.  Isotretinoin  Counseling; Review and  Contraception Counseling: Reviewed potential side effects of isotretinoin  including xerosis, cheilitis, hepatitis, hyperlipidemia, and severe birth defects if taken by a pregnant woman.  Women on isotretinoin  must be celibate (not having sex) or required to use at least 2 birth control methods to prevent pregnancy (unless patient is a female of non-child bearing potential).  Females of child-bearing potential must have monthly pregnancy tests while on isotretinoin  and report through I-Pledge (FDA monitoring program). Reviewed reports of suicidal ideation in those with a history of depression while taking isotretinoin  and reports of diagnosis of inflammatory bowl disease (IBD) while taking isotretinoin  as well as the lack of evidence for a causal relationship between isotretinoin , depression and IBD. Patient advised to reach out with any questions or concerns. Patient advised not to share pills or donate blood while on treatment or for one month after completing treatment. All patient's considering Isotretinoin  must read and understand and sign Isotretinoin  Consent Form and be registered with I-Pledge.  Consider cetirizine - increases efficacy Consider fish oil - helps with dryness    Was sun protection counseling provided?: No   Level of service outlined above   Patient instructions (SKPI)   Procedures, orders, diagnosis for this visit:    There are no diagnoses linked to this encounter.  Return to clinic: Return in about 1 month (around 05/11/2024) for Accutane  f/u, w/ Dr. Raymund.  I, Almetta Nora, RMA, am acting as scribe for Lauraine JAYSON Raymund, MD .   Documentation: I have reviewed the above documentation for accuracy and completeness, and I agree with the above.  Lauraine JAYSON Raymund, MD  "

## 2024-04-10 NOTE — Patient Instructions (Signed)

## 2024-05-03 ENCOUNTER — Ambulatory Visit: Admitting: Nurse Practitioner

## 2024-05-04 ENCOUNTER — Other Ambulatory Visit: Payer: Self-pay | Admitting: Nurse Practitioner

## 2024-05-04 ENCOUNTER — Other Ambulatory Visit: Payer: Self-pay

## 2024-05-04 DIAGNOSIS — L308 Other specified dermatitis: Secondary | ICD-10-CM

## 2024-05-04 MED FILL — Escitalopram Oxalate Tab 20 MG (Base Equiv): ORAL | 90 days supply | Qty: 90 | Fill #1 | Status: AC

## 2024-05-04 MED FILL — "Insulin Pen Needle 32 G X 6 MM (1/4"" or 15/64"")": 90 days supply | Qty: 100 | Fill #1 | Status: AC

## 2024-05-05 ENCOUNTER — Other Ambulatory Visit: Payer: Self-pay

## 2024-05-05 MED ORDER — TRIAMCINOLONE ACETONIDE 0.1 % EX CREA
1.0000 | TOPICAL_CREAM | Freq: Two times a day (BID) | CUTANEOUS | 0 refills | Status: AC | PRN
  Filled 2024-05-05: qty 80, 40d supply, fill #0

## 2024-05-05 NOTE — Telephone Encounter (Signed)
 Requested medications are due for refill today.  yes  Requested medications are on the active medications list.  yes  Last refill. 03/01/2023 80 g 1 rf  Future visit scheduled.   yes  Notes to clinic.  Refill not delegated.     Requested Prescriptions  Pending Prescriptions Disp Refills   triamcinolone  cream (KENALOG ) 0.1 % 80 g 1    Sig: Apply 1 application topically 2 (two) times daily as needed. For rash. Avoid applying to face, groin, and axilla. Use as directed. [place]-term use can cause thinning of the skin.     Not Delegated - Dermatology:  Corticosteroids Failed - 05/05/2024 11:08 AM      Failed - This refill cannot be delegated      Passed - Valid encounter within last 12 months    Recent Outpatient Visits           1 month ago Type 2 diabetes mellitus with diabetic neuropathy, without [place]-term current use of insulin  [facility])   [facility] Gareth Mliss FALCON, FNP   5 months ago Other fatigue   [facility] Gareth Mliss FALCON, FNP   9 months ago Encounter for completion of form with patient   [facility] Gareth Mliss FALCON, FNP   9 months ago Uncontrolled type 2 diabetes mellitus with hyperglycemia [facility])   [facility] Gareth Mliss FALCON, FNP       Future Appointments             In 6 days Raymund, Lauraine BROCKS, MD [facility]

## 2024-05-08 ENCOUNTER — Other Ambulatory Visit: Payer: Self-pay

## 2024-05-10 ENCOUNTER — Other Ambulatory Visit: Payer: Self-pay

## 2024-05-11 ENCOUNTER — Ambulatory Visit

## 2024-05-11 ENCOUNTER — Other Ambulatory Visit: Payer: Self-pay

## 2024-05-11 DIAGNOSIS — L700 Acne vulgaris: Secondary | ICD-10-CM

## 2024-05-11 DIAGNOSIS — Z7189 Other specified counseling: Secondary | ICD-10-CM | POA: Diagnosis not present

## 2024-05-11 DIAGNOSIS — Z79899 Other long term (current) drug therapy: Secondary | ICD-10-CM | POA: Diagnosis not present

## 2024-05-11 MED ORDER — ISOTRETINOIN 40 MG PO CAPS
80.0000 mg | ORAL_CAPSULE | Freq: Every day | ORAL | 0 refills | Status: AC
  Filled 2024-05-11 – 2024-05-12 (×2): qty 60, 30d supply, fill #0

## 2024-05-11 NOTE — Patient Instructions (Addendum)
 Isotretinoin (oral)  Pronunciation:  EYE so TRET i noyn Brand:  Absorica, Amnesteem, Claravis, Myorisan, Sotret, Zenatane  What is the most important information I should know about isotretinoin? Isotretinoin in just a single dose can cause severe birth defects or death of a baby. Never use this medicine if you are pregnant or may become pregnant. You must have a negative pregnancy test before taking isotretinoin. You will also be required to use two forms of birth control to prevent pregnancy while taking this medicine. Stop using isotretinoin and call your doctor at once if you think you might be pregnant.  What is isotretinoin? Isotretinoin is a form of vitamin A. It reduces the amount of oil released by oil glands in your skin, and helps your skin renew itself more quickly. Isotretinoin is used to treat severe nodular acne that has not responded to other treatments, including antibiotics. Isotretinoin is available only from a certified pharmacy under a special program called iPLEDGE. Isotretinoin may also be used for purposes not listed in this medication guide.  What should I discuss with my healthcare provider before taking isotretinoin? Isotretinoin can cause miscarriage, premature birth, severe birth defects, or death of a baby if the mother takes this medicine at the time of conception or during pregnancy. Even one dose of isotretinoin can cause major birth defects of the baby's ears, eyes, face, skull, heart, and brain. Never use isotretinoin if you are pregnant. For Women: Unless you have had your uterus and ovaries removed (total hysterectomy) or have been in menopause for at least 12 months in a row, you are considered to be of child-bearing potential. You must have a negative pregnancy test before you start taking isotretinoin, before each prescription is refilled, right after you take your last dose of isotretinoin, and again 30 days later. All pregnancy testing is required by the  [facility] program. You must agree in writing to use two specific forms of birth control beginning 30 days before you start taking isotretinoin and ending 30 days after your last dose. Both a primary and a secondary form of birth control must be used together.  Primary forms of birth control include: tubal ligation (tubes tied); vasectomy of the female sexual partner; an IUD (intrauterine device); estrogen-containing birth control pills (not mini-pills); and hormonal birth control patches, implants, injections, or vaginal ring. Secondary forms of birth control include: a female latex condom with or without spermicide; a diaphragm plus a spermicide; a cervical cap plus a spermicide; and a vaginal sponge containing a spermicide.  Not having sexual intercourse (abstinence) is the most effective method of preventing pregnancy. Stop using isotretinoin and call your doctor at once if you have unprotected sex, if you quit using birth control, if your period is late, or if you think you might be pregnant. If you get pregnant while taking isotretinoin, call the iPLEDGE pregnancy registry at [phone]. You should not use isotretinoin if you are allergic to it. Tell your doctor if you have ever had: depression or mental illness; asthma; liver disease; diabetes; heart disease or high cholesterol; osteoporosis or low bone mineral density; an eating disorder such as anorexia; a food or drug allergy; or an intestinal disorder such as inflammatory bowel disease or ulcerative colitis.  It is dangerous to try and purchase isotretinoin on the Internet or from vendors outside of the [place] . The sale and distribution of isotretinoin outside of the iPLEDGE program violates the regulations of the U.S. Food and Drug Administration for the safe use  of this medication. You should not breast-feed while using this medicine. Isotretinoin is not approved for use by anyone younger than 32 years old.  How  should I take isotretinoin? Follow all directions on your prescription label and read all medication guides or instruction sheets. Use the medicine exactly as directed. Each prescription of isotretinoin must be filled within 7 days of the date it was written by your doctor. You will receive no more than a 30-day supply of isotretinoin at one time. Always take isotretinoin with a full glass of water. Do not chew or suck on the capsule. Swallow it whole. Follow all directions about taking isotretinoin with or without food. Use this medicine for the full prescribed length of time. Your acne may seem to get worse at first, but should then begin to improve. You may need frequent blood tests.  Never share this medicine with another person, even if they have the same symptoms you have.  Store at room temperature away from moisture, heat, and light.  What happens if I miss a dose? Skip the missed dose and use your next dose at the regular time. Do not use two doses at one time.  What happens if I overdose? Seek emergency medical attention or call the Poison Help line at [phone]. Overdose symptoms may include headache, dizziness, vomiting, stomach pain, warmth or tingling in your face, swollen or cracked lips, and loss of balance or coordination.  What should I avoid while taking isotretinoin? Do not take a vitamin or mineral supplement that contains vitamin A.  Do not donate blood while taking isotretinoin and for at least 30 days after you stop taking it.  Donated blood that is later given to a pregnant woman could lead to birth defects in her baby if the blood contains any level of isotretinoin. While you are taking isotretinoin and for at least 6 months after your last dose: Do not use wax hair removers or have dermabrasion or laser skin treatments. Scarring may result. Isotretinoin could make you sunburn more easily. Avoid sunlight or tanning beds. Wear protective clothing and use sunscreen  (SPF 30 or higher) when you are outdoors. Avoid driving or hazardous activity until you know how this medicine will affect you. Isotretinoin may impair your vision, especially at night.  What are the possible side effects of isotretinoin? Get emergency medical help if you have signs of an allergic reaction (hives, difficult breathing, swelling in your face or throat) or a severe skin reaction (fever, sore throat, burning eyes, skin pain, red or purple skin rash with blistering and peeling). Stop using isotretinoin and call your doctor at once if you have: problems with your vision or hearing; hallucinations, (see or hearing things that are not real), thoughts about suicide or hurting yourself; depressed mood, crying spells, changes in behavior, feeling angry or irritable; loss of interest in things you enjoyed before, feeling hopeless or guilty; sleep problems, extreme tiredness, trouble concentrating; changes in weight or appetite; a seizure (convulsions), sudden numbness or weakness; muscle weakness, pain in your bones or joints or in your back; severe diarrhea, rectal bleeding, bloody or tarry stools; pale skin, feeling light-headed or short of breath; severe stomach or chest pain, pain when swallowing; or dark urine, or jaundice (yellowing of your skin or eyes). Common side effects may include: dryness of your skin, lips, eyes, or nose (you may have nosebleeds). This is not a complete list of side effects and others may occur. Call your doctor for medical advice  about side effects. You may report side effects to FDA at 1-800-FDA-1088.  What other drugs will affect isotretinoin? Tell your doctor about all your other medicines, especially: phenytoin; St. John's wort; vitamin or mineral supplements; progestin-only birth control pills (mini-pills); steroid medicine; or a tetracycline antibiotic, including doxycycline or minocycline. This list is not complete. Other drugs may affect  isotretinoin, including prescription and over-the-counter medicines, vitamins, and herbal products. Not all possible drug interactions are listed here.  Where can I get more information? Your pharmacist can provide more information about isotretinoin. Remember, keep this and all other medicines out of the reach of children, never share your medicines with others, and use this medication only for the indication prescribed. Every effort has been made to ensure that the information provided by [employer] ('[employer]') is accurate, up-to-date, and complete, but no guarantee is made to that effect. Drug information contained herein may be time sensitive. [employer] information has been compiled for use by healthcare practitioners and consumers in the [place]  and therefore [employer] does not warrant that uses outside of the [place]  are appropriate, unless specifically indicated otherwise. [employer]'s drug information does not endorse drugs, diagnose patients or recommend therapy. [employer]'s drug information is an Investment banker, corporate to assist licensed healthcare practitioners in caring for their patients and/or to serve consumers viewing this service as a supplement to, and not a substitute for, the expertise, skill, knowledge and judgment of healthcare practitioners. The absence of a warning for a given drug or drug combination in no way should be construed to indicate that the drug or drug combination is safe, effective or appropriate for any given patient. Mult[employer] does not assume any responsibility for any aspect of healthcare administered with the aid of information [employer] provides. The information contained herein is not intended to cover all possible uses, directions, precautions, warnings, drug interactions, allergic reactions, or adverse effects. If you have questions about the drugs you are taking, check with your doctor, nurse or pharmacist.  Copyright [phone] [employer]  Version: 11.01. Revision date: 09/21/2016. Care instructions adapted under license by [facility]. If you have questions about a medical condition or this instruction, always ask your healthcare professional. [employer], Incorporated disclaims any warranty or liability for your use of this information.   Coping with the Dryness from Accutane  Dry eyes? Use preservative-free eye drops  Dry nose or bloody nose (due to dry nose)? Use saline nasal spray (NOT afrin-type products, saline only)  Dry face? Look for oil-free and non-comedogenic (means does not clog pores) lotions to moisturize acne-prone areas. Neutrogena, Aveeno, Oil of Olay. Sunscreen: Neutrogena Clear Face  Dry lips? Throughout the day and at bedtime, apply a lip balm that contains petrolatum/petroleum jelly or dimethicone. NOT chapstick, eos, burt's bees. Plain Vaseline (petroleum jelly) Aquaphor ointment Vaniply ointment Fix My Skin healing lip balm Cerave healing ointment  Dry skin on arms and legs?  Recommended body washes: Dove Sensitive Skin Nourishing Body Wash Unscented Aveeno Active Naturals Skin Relief Body Wash, Fragrance Free Cerave Hydrating Cleanser Olay Ultra Moisture Body Wash/ Olay Sensitive Body Wash Free & Clear (vanicream) liquid cleanser  Moisturizer:  Ointments and creams are thicker and thus provide better moisturization.   Recommended ointments: greasy, but do the best job at [employer] (vanicream) ointment [facility] [employer], Therapist, occupational, [employer]) Plain Vaseline (petroleum jelly) Aquaphor Healing Ointment Cerave Healing ointment  Recommended creams: Vanicream cream ([place] [employer], Therapist, occupational, [employer]) Cetaphil Moisturizing Cream CeraVe Moisturizing Cream Aveeno [employer]  Eczema Therapy Fragrance Free Moisturizing Cream (even if not a baby!) Aveeno skin relief overnight cream Eucerin Body Creme Eczema Relief Eucerin Original Healing Soothing Repair  Cream Eucerin Professional Repair intensive repair cream Gold Bond Diabetics Dry Skin relief hand or foot cream  Gold Bond eczema relief cream (not lotion)  You can use eczema creams even if you do not have eczema. This just means they are more moisturizing.  Lotions (come in a pump) are the weakest moisturizers.      Due to recent changes in healthcare laws, you may see results of your pathology and/or laboratory studies on MyChart before the doctors have had a chance to review them. We understand that in some cases there may be results that are confusing or concerning to you. Please understand that not all results are received at the same time and often the doctors may need to interpret multiple results in order to provide you with the best plan of care or course of treatment. Therefore, we ask that you please give us  2 business days to thoroughly review all your results before contacting the office for clarification. Should we see a critical lab result, you will be contacted sooner.   If You Need Anything After Your Visit  If you have any questions or concerns for your doctor, please call our main line at [phone] and press option 4 to reach your doctor's medical assistant. If no one answers, please leave a voicemail as directed and we will return your call as soon as possible. Messages left after 4 pm will be answered the following business day.   You may also send us  a message via MyChart. We typically respond to MyChart messages within 1-2 business days.  For prescription refills, please ask your pharmacy to contact our office. Our fax number is [phone].  If you have an urgent issue when the clinic is closed that cannot wait until the next business day, you can page your doctor at the number below.    Please note that while we do our best to be available for urgent issues outside of office hours, we are not available 24/7.   If you have an urgent issue and are unable to reach  us , you may choose to seek medical care at your doctor's office, retail clinic, urgent care center, or emergency room.  If you have a medical emergency, please immediately call 911 or go to the emergency department.  Pager Numbers  - Dr. Hester: [phone]  - Dr. Jackquline: [phone]  - Dr. Claudene: [phone]   - Dr. Raymund: [phone]  In the event of inclement weather, please call our main line at [phone] for an update on the status of any delays or closures.  Dermatology Medication Tips: Please keep the boxes that topical medications come in in order to help keep track of the instructions about where and how to use these. Pharmacies typically print the medication instructions only on the boxes and not directly on the medication tubes.   If your medication is too expensive, please contact our office at [phone] option 4 or send us  a message through MyChart.   We are unable to tell what your co-pay for medications will be in advance as this is different depending on your insurance coverage. However, we may be able to find a substitute medication at lower cost or fill out paperwork to get insurance to cover a needed medication.   If a prior authorization is required to get your medication covered by  your insurance company, please allow us  1-2 business days to complete this process.  Drug prices often vary depending on where the prescription is filled and some pharmacies may offer cheaper prices.  The website www.goodrx.com contains coupons for medications through different pharmacies. The prices here do not account for what the cost may be with help from insurance (it may be cheaper with your insurance), but the website can give you the price if you did not use any insurance.  - You can print the associated coupon and take it with your prescription to the pharmacy.  - You may also stop by our office during regular business hours and pick up a GoodRx coupon card.  - If  you need your prescription sent electronically to a different pharmacy, notify our office through [facility] or by phone at [phone] option 4.     Si Usted Necesita Algo Despus de Su Visita  Tambin puede enviarnos un mensaje a travs de Clinical cytogeneticist. Por lo general respondemos a los mensajes de MyChart en el transcurso de 1 a 2 das hbiles.  Para renovar recetas, por favor pida a su farmacia que se ponga en contacto con nuestra oficina. Randi lakes de fax es [place] [phone].  Si tiene un asunto urgente cuando la clnica est cerrada y que no puede esperar hasta el siguiente da hbil, puede llamar/localizar a su doctor(a) al nmero que aparece a continuacin.   Por favor, tenga en cuenta que aunque hacemos todo lo posible para estar disponibles para asuntos urgentes fuera del horario de [place], no estamos disponibles las 24 horas del da, los 7 [address]  [address] de la [place].   Si tiene un problema urgente y no puede comunicarse con nosotros, puede optar por buscar atencin mdica  en el consultorio de su doctor(a), en una clnica privada, en un centro de atencin urgente o en una sala de emergencias.  Si tiene Engineer, drilling, por favor llame inmediatamente al 911 o vaya a la sala de emergencias.  Nmeros de bper  - Dr. Hester: [phone]  - Dra. Jackquline: [phone]  - Dr. Claudene: [phone]  - Dra. Julietta Batterman: [phone]  En caso de inclemencias del [place], por favor llame a nuestra lnea principal al [phone] para una actualizacin sobre el estado de cualquier retraso o cierre.  Consejos para la medicacin en dermatologa: Por favor, guarde las cajas en las que vienen los medicamentos de uso tpico para ayudarle a seguir las instrucciones sobre dnde y cmo usarlos. Las farmacias generalmente imprimen las instrucciones del medicamento slo en las cajas y no directamente en los tubos del [place].   Si su medicamento es muy caro, por favor, pngase en contacto  con landry rieger llamando al [phone] y presione la opcin 4 o envenos un mensaje a travs de Clinical cytogeneticist.   No podemos decirle cul ser su copago por los medicamentos por adelantado ya que esto es diferente dependiendo de la cobertura de su seguro. Sin embargo, es posible que podamos encontrar un medicamento sustituto a Audiological scientist un formulario para que el seguro cubra el medicamento que se considera necesario.   Si se requiere una autorizacin previa para que su compaa de seguros malta su medicamento, por favor permtanos de 1 a 2 das hbiles para completar este proceso.  Los precios de los medicamentos varan con frecuencia dependiendo del Environmental consultant de dnde se surte la receta y alguna farmacias pueden ofrecer precios ms baratos.  El sitio web www.goodrx.com tiene cupones para medicamentos de Health and safety inspector.  Los precios aqu no tienen en cuenta lo que podra costar con la ayuda del seguro (puede ser ms barato con su seguro), pero el sitio web puede darle el precio si no utiliz Tourist information centre manager.  - Puede imprimir el cupn correspondiente y llevarlo con su receta a la farmacia.  - Tambin puede pasar por nuestra oficina durante el horario de atencin regular y Education officer, museum una tarjeta de cupones de GoodRx.  - Si necesita que su receta se enve electrnicamente a una farmacia diferente, informe a nuestra oficina a travs de MyChart de [place] o por telfono llamando al [phone] y presione la opcin 4.

## 2024-05-11 NOTE — Progress Notes (Deleted)
" °  °  Subjective   Nicole Chambers is a 34 y.o. female who presents for the following: Acne on Accutane  . Patient is established patient   Today patient reports: Patient is here for Accutane  f/u. She has been experiencing dryness.   Review of Systems:    Isotretinoin  ROS - denies any bone pain, muscle pain, nausea, vomiting, diarrhea, blood in stool or urine .  The following portions of the chart were reviewed this encounter and updated as appropriate: medications, allergies, medical history  Relevant Medical History:  n/a   Objective  (SKPE) Well appearing patient in no apparent distress; mood and affect are within normal limits. Examination was performed of the: Focused Exam of: face   Examination notable for: Acne vulgaris: Scattered open and closed comedones on the face. Red, inflammatory papules and pustules on face   Examination limited by: Undergarments, Shoes or socks , and Clothing     Assessment & Plan  (SKAP)   Severe Inflammatory Acne, on Isotretinoin  Chronic condition with exacerbation or progression. Condition is not at treatment goal.  - Previous doses (40 mg daily)  - iPledge #: [id number] - Dosage weight: 195lb or 88.5 kg  - Cumulative dose: 13.55 mg/kg  Total mg 1200  - increase isotretinoin  80 mg daily   High Risk Medication Use (isotretinoin ) - UPT obtained today and negative - Urine pregnancy test performed in office today and was negative.  Patient demonstrates comprehension and confirms she will not get pregnant.  Lot: [id number] Exp: 09-29-2025 - Methods of contraception: Oral birth control, Female latex condoms   - Baseline ALT, TG wnl  - Patient understands that she must not become pregnant while on the medication - Patient understands to call with questions/concerns regarding the medication or side effects.  - Patient also understands importance of not sharing medication or donating blood while on therapy.  Isotretinoin  Counseling; Review and  Contraception Counseling: Reviewed potential side effects of isotretinoin  including xerosis, cheilitis, hepatitis, hyperlipidemia, and severe birth defects if taken by a pregnant woman.  Women on isotretinoin  must be celibate (not having sex) or required to use at least 2 birth control methods to prevent pregnancy (unless patient is a female of non-child bearing potential).  Females of child-bearing potential must have monthly pregnancy tests while on isotretinoin  and report through I-Pledge (FDA monitoring program). Reviewed reports of suicidal ideation in those with a history of depression while taking isotretinoin  and reports of diagnosis of inflammatory bowl disease (IBD) while taking isotretinoin  as well as the lack of evidence for a causal relationship between isotretinoin , depression and IBD. Patient advised to reach out with any questions or concerns. Patient advised not to share pills or donate blood while on treatment or for one month after completing treatment. All patient's considering Isotretinoin  must read and understand and sign Isotretinoin  Consent Form and be registered with I-Pledge.  Consider cetirizine - increases efficacy Consider fish oil - helps with dryness    Was sun protection counseling provided?: No   Level of service outlined above   Patient instructions (SKPI)   Procedures, orders, diagnosis for this visit:    There are no diagnoses linked to this encounter.  Return to clinic: No follow-ups on file.  LILLETTE Nicole Chambers, CMA, am acting as scribe for Nicole JAYSON Kanaris, MD .  Documentation: I have reviewed the above documentation for accuracy and completeness, and I agree with the above.  Nicole JAYSON Kanaris, MD  "

## 2024-05-11 NOTE — Progress Notes (Signed)
 "   Subjective   Nicole Chambers is a 34 y.o. female who presents for the following: Acne on Accutane  . Patient is established patient   Today patient reports: Accutane  f/u currently taking 80 mg was previously on 40 mg. Patient states no negative side effects. Currently on Lexipro.  Review of Systems:    Isotretinoin  ROS - denies any bone pain, muscle pain, nausea, vomiting, diarrhea, blood in stool or urine .  The following portions of the chart were reviewed this encounter and updated as appropriate: medications, allergies, medical history  Relevant Medical History:  n/a   Objective  (SKPE) Well appearing patient in no apparent distress; mood and affect are within normal limits. Examination was performed of the: Focused Exam of: face   Examination notable for: few inflamed papules PIH  Examination limited by: Undergarments, Shoes or socks , and Clothing     Assessment & Plan  (SKAP)   Severe Inflammatory Acne, on Isotretinoin  Chronic condition with exacerbation or progression. Condition is not at treatment goal.  - Previous doses (40 mg, 80 mg)  - iPledge #: [id number]  - Dosage weight: 88.5 kg - Cumulative dose: 40.67 mg/kg  - Continue isotretinoin  80 mg daily   High Risk Medication Use (isotretinoin ) - UPT obtained today and negative Lot: [id number] Exp: 10/30/2025 - Baseline TG and ALT from 02/02/24 previously reviewed and WNL   -  Recheck TG and ALT 1 month after highest dose  - Methods of contraception: Oral birth control, Female latex condoms    - Patient understands that she must not become pregnant while on the medication - Patient understands to call with questions/concerns regarding the medication or side effects.  - Patient also understands importance of not sharing medication or donating blood while on therapy.    - Patient w/ hx of depression but states well controlled on lexapro  - she will notify us  if any mood changes   Isotretinoin  Counseling; Review and  Contraception Counseling: Reviewed potential side effects of isotretinoin  including xerosis, cheilitis, hepatitis, hyperlipidemia, and severe birth defects if taken by a pregnant woman.  Women on isotretinoin  must be celibate (not having sex) or required to use at least 2 birth control methods to prevent pregnancy (unless patient is a female of non-child bearing potential).  Females of child-bearing potential must have monthly pregnancy tests while on isotretinoin  and report through I-Pledge (FDA monitoring program). Reviewed reports of suicidal ideation in those with a history of depression while taking isotretinoin  and reports of diagnosis of inflammatory bowl disease (IBD) while taking isotretinoin  as well as the lack of evidence for a causal relationship between isotretinoin , depression and IBD. Patient advised to reach out with any questions or concerns. Patient advised not to share pills or donate blood while on treatment or for one month after completing treatment. All patient's considering Isotretinoin  must read and understand and sign Isotretinoin  Consent Form and be registered with I-Pledge.  Consider cetirizine - increases efficacy Consider fish oil - helps with dryness     Was sun protection counseling provided?: Yes   Level of service outlined above   Patient instructions (SKPI)   Procedures, orders, diagnosis for this visit:  ACNE VULGARIS   HIGH RISK MEDICATION USE    Acne vulgaris  High risk medication use  Other orders -     ISOtretinoin ; Take 2 capsules (80 mg total) by mouth daily with fat containing meal  Dispense: 60 capsule; Refill: 0    Return to clinic: Return in about  1 month (around 06/10/2024) for accutane  f/u , w/ Dr. Raymund.  I, Almetta Nora, RMA, am acting as scribe for Lauraine JAYSON Raymund, MD .   Documentation: I have reviewed the above documentation for accuracy and completeness, and I agree with the above.  Lauraine JAYSON Raymund, MD  "

## 2024-05-12 ENCOUNTER — Other Ambulatory Visit: Payer: Self-pay

## 2024-05-22 ENCOUNTER — Encounter: Admitting: Nurse Practitioner

## 2024-05-29 ENCOUNTER — Ambulatory Visit: Payer: Self-pay

## 2024-06-12 ENCOUNTER — Ambulatory Visit

## 2024-09-15 ENCOUNTER — Ambulatory Visit: Admitting: Nurse Practitioner
